# Patient Record
Sex: Male | Born: 1960
Health system: Southern US, Community
[De-identification: ages and names within clinical notes are randomized; demographics above are authoritative.]

## PROBLEM LIST (undated history)

## (undated) DIAGNOSIS — K5190 Ulcerative colitis, unspecified, without complications: Secondary | ICD-10-CM

## (undated) DIAGNOSIS — R7303 Prediabetes: Secondary | ICD-10-CM

## (undated) DIAGNOSIS — G5603 Carpal tunnel syndrome, bilateral upper limbs: Secondary | ICD-10-CM

## (undated) DIAGNOSIS — E790 Hyperuricemia without signs of inflammatory arthritis and tophaceous disease: Secondary | ICD-10-CM

## (undated) HISTORY — DX: Carpal tunnel syndrome, bilateral upper limbs: G56.03

## (undated) HISTORY — DX: Prediabetes: R73.03

## (undated) HISTORY — DX: Hyperuricemia without signs of inflammatory arthritis and tophaceous disease: E79.0

## (undated) HISTORY — DX: Ulcerative colitis, unspecified, without complications: K51.90

---

## 2000-03-16 ENCOUNTER — Ambulatory Visit (HOSPITAL_COMMUNITY): Admission: RE | Admit: 2000-03-16 | Discharge: 2000-03-16 | Payer: Self-pay | Admitting: Gastroenterology

## 2005-01-21 ENCOUNTER — Ambulatory Visit (HOSPITAL_COMMUNITY): Admission: RE | Admit: 2005-01-21 | Discharge: 2005-01-21 | Payer: Self-pay | Admitting: Orthopedic Surgery

## 2007-01-01 IMAGING — CR DG ORBITS FOR FOREIGN BODY
2 series · 2 of 2 positions shown · non-contrast
Comparison: None.

CLINICAL DATA: Pre-MRI.  
 ORBITS ? 2 VIEWS:

[view not recorded (1 of 2)]
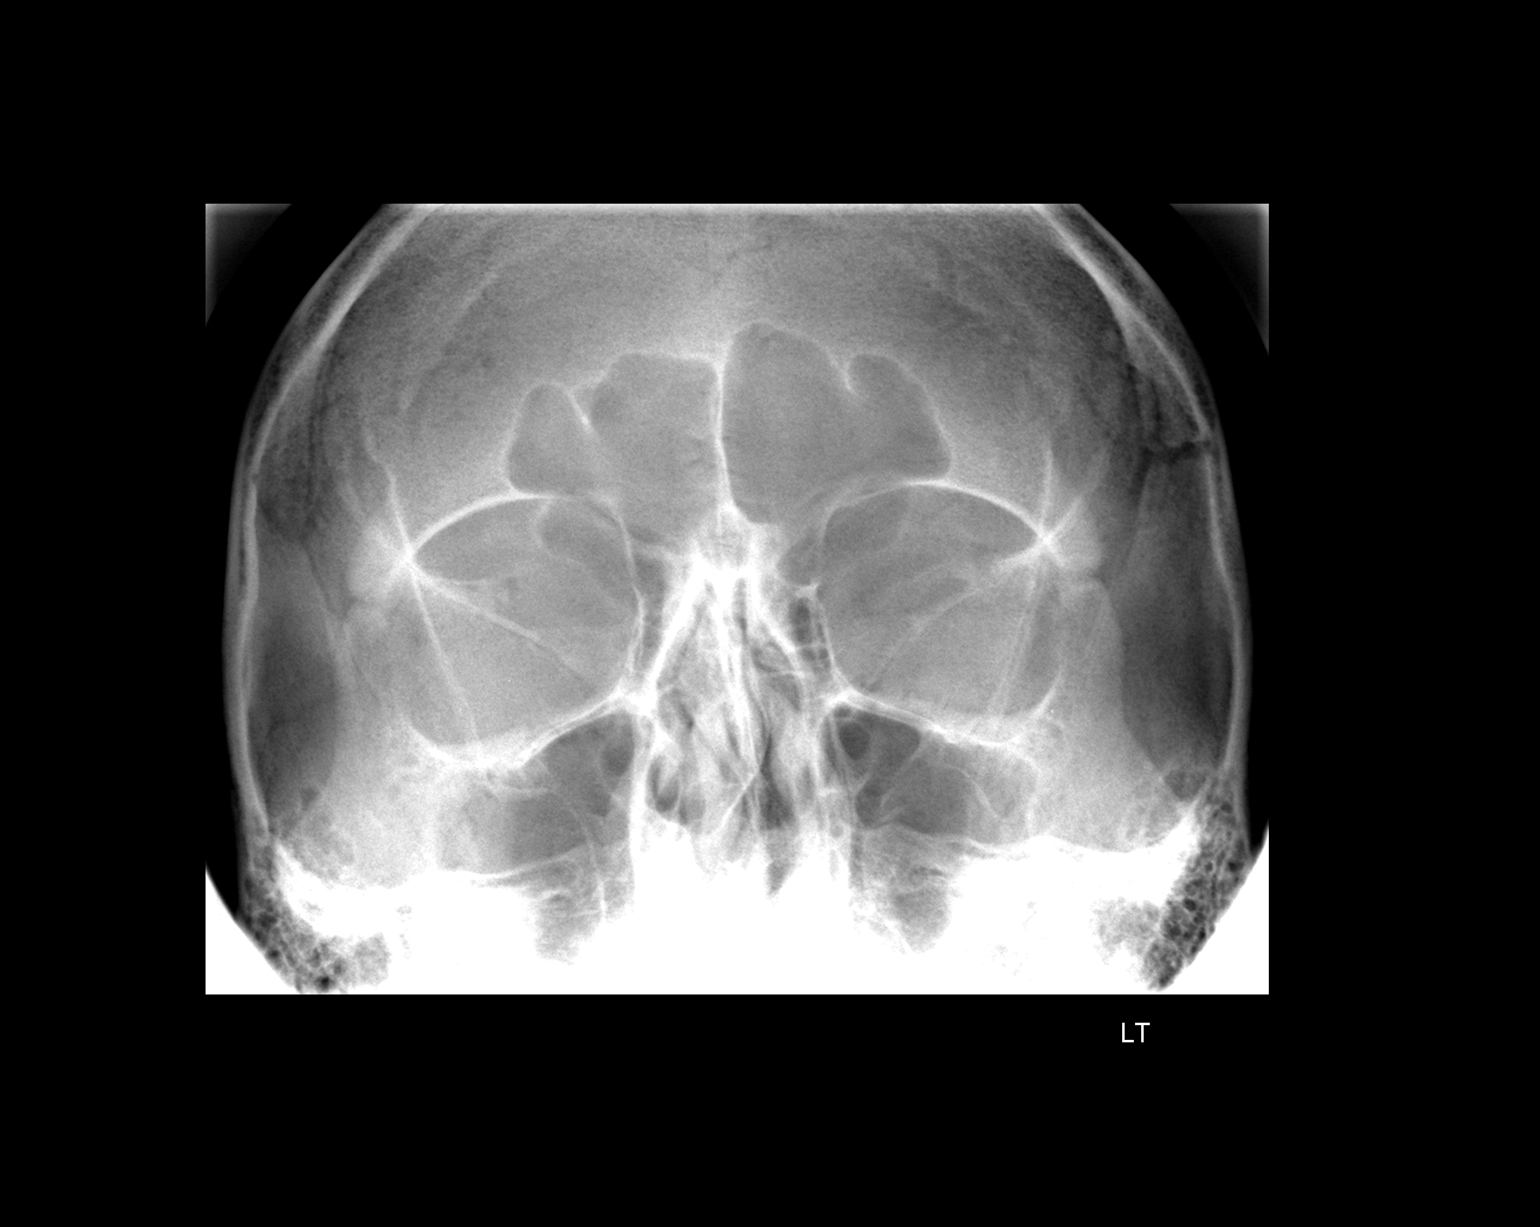

[view not recorded (2 of 2)]
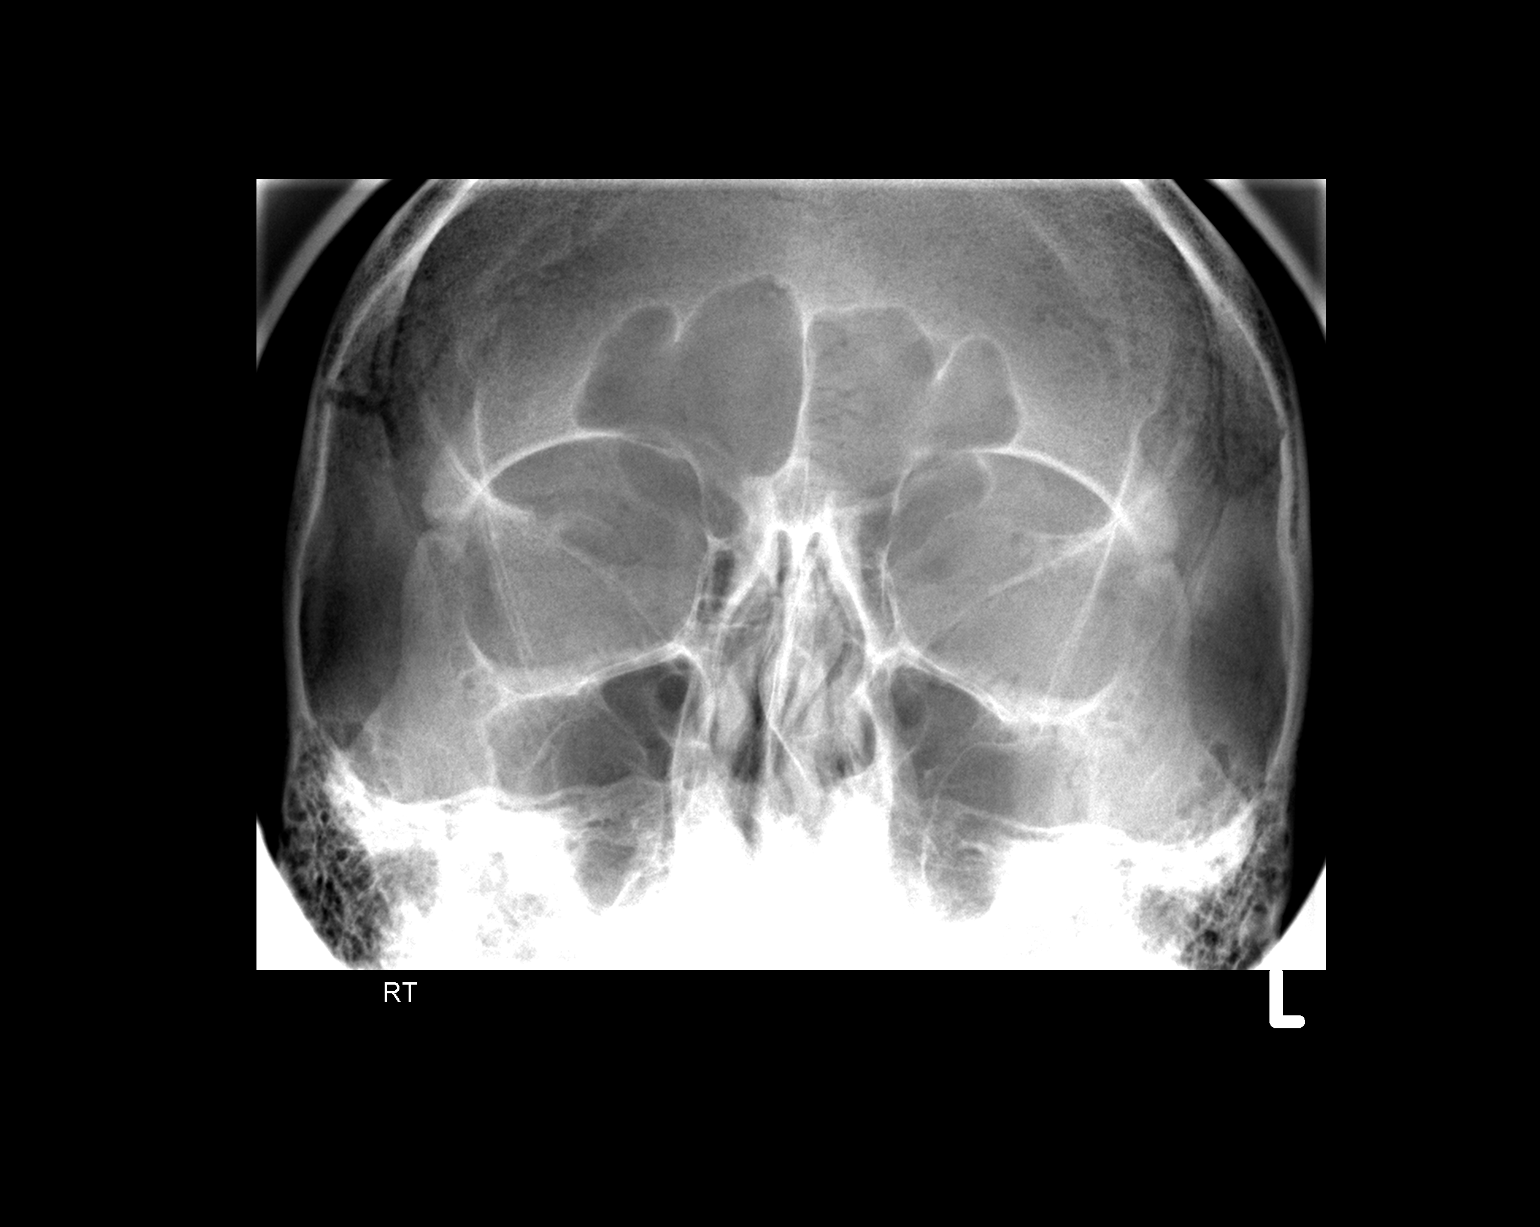

[2 of 2 positions shown; findings below may reference images not displayed]

FINDINGS: No radiopaque foreign bodies are identified.
IMPRESSION: No radiopaque foreign bodies noted.

## 2014-07-07 ENCOUNTER — Emergency Department (HOSPITAL_COMMUNITY): Payer: Worker's Compensation

## 2014-07-07 ENCOUNTER — Emergency Department (HOSPITAL_COMMUNITY)
Admission: EM | Admit: 2014-07-07 | Discharge: 2014-07-07 | Disposition: A | Discharge status: Home / Self Care | Payer: Worker's Compensation | Attending: Emergency Medicine | Admitting: Emergency Medicine

## 2014-07-07 ENCOUNTER — Encounter (HOSPITAL_COMMUNITY): Payer: Self-pay | Admitting: *Deleted

## 2014-07-07 DIAGNOSIS — S61215A Laceration without foreign body of left ring finger without damage to nail, initial encounter: Secondary | ICD-10-CM | POA: Insufficient documentation

## 2014-07-07 DIAGNOSIS — Y9289 Other specified places as the place of occurrence of the external cause: Secondary | ICD-10-CM | POA: Diagnosis not present

## 2014-07-07 DIAGNOSIS — Y998 Other external cause status: Secondary | ICD-10-CM | POA: Diagnosis not present

## 2014-07-07 DIAGNOSIS — Y9389 Activity, other specified: Secondary | ICD-10-CM | POA: Diagnosis not present

## 2014-07-07 DIAGNOSIS — Z23 Encounter for immunization: Secondary | ICD-10-CM | POA: Insufficient documentation

## 2014-07-07 DIAGNOSIS — Y288XXA Contact with other sharp object, undetermined intent, initial encounter: Secondary | ICD-10-CM | POA: Diagnosis not present

## 2014-07-07 DIAGNOSIS — S61219A Laceration without foreign body of unspecified finger without damage to nail, initial encounter: Secondary | ICD-10-CM

## 2014-07-07 MED ORDER — TETANUS-DIPHTH-ACELL PERTUSSIS 5-2.5-18.5 LF-MCG/0.5 IM SUSP
0.5000 mL | Freq: Once | INTRAMUSCULAR | Status: AC
  Administered 2014-07-07: 0.5 mL via INTRAMUSCULAR
  Filled 2014-07-07: qty 0.5

## 2014-07-07 MED ORDER — LIDOCAINE HCL (PF) 1 % IJ SOLN
INTRAMUSCULAR | Status: AC
  Filled 2014-07-07: qty 5

## 2014-07-07 MED ORDER — BACITRACIN ZINC 500 UNIT/GM EX OINT
TOPICAL_OINTMENT | CUTANEOUS | Status: AC
  Filled 2014-07-07: qty 0.9

## 2014-07-07 NOTE — ED Notes (Signed)
Cut left middle finger while putting trash in trash can.

## 2014-07-07 NOTE — ED Notes (Addendum)
Finger is bandaged; pt reports finger was bleeding badly and thinks it will need a few stitches.

## 2014-07-07 NOTE — ED Notes (Signed)
ED PA at bedside

## 2014-07-07 NOTE — ED Provider Notes (Signed)
CSN: [id number]     Arrival date & time 07/07/14  8756 History   First MD Initiated Contact with Patient 07/07/14 [phone]     Chief Complaint  Patient presents with  . Laceration     (Consider location/radiation/quality/duration/timing/severity/associated sxs/prior Treatment) Patient is a 54 y.o. male presenting with skin laceration.  Laceration Location:  Hand Hand laceration location:  L finger Depth:  Through underlying tissue Quality: straight   Bleeding: controlled with pressure   Time since incident: just prior to arrival to the ED. Laceration mechanism:  Broken glass Pain details:    Quality:  Dull   Severity:  Mild Foreign body present:  Unable to specify Relieved by:  Pressure Worsened by:  Nothing tried Tetanus status:  Up to date  Jonathan Reeves is a 54 y.o. male who presents to the ED with a laceration to the let midle finger. He states while putting trash in the trash can he cut his finger on glass.   No past medical history on file. No past surgical history on file. No family history on file. History  Substance Use Topics  . Smoking status: Never Smoker   . Smokeless tobacco: Not on file  . Alcohol Use: 0.6 oz/week    1 Cans of beer per week     Comment: occasional    Review of Systems Negative except as stated in HPI   Allergies  Review of patient's allergies indicates no known allergies.  Home Medications   Prior to Admission medications   Not on File   BP 130/95 mmHg  Pulse 65  Temp(Src) 98 F (36.7 C) (Oral)  Resp 18  Ht 5\' 6"  (1.676 m)  Wt 202 lb (91.627 kg)  BMI 32.62 kg/m2  SpO2 100% Physical Exam  Constitutional: He is oriented to person, place, and time. He appears well-developed and well-nourished.  Eyes: EOM are normal.  Neck: Neck supple.  Pulmonary/Chest: Effort normal.  Abdominal: Soft. There is no tenderness.  Musculoskeletal: Normal range of motion.       Hands: Neurological: He is alert and oriented to person, place, and  time. No cranial nerve deficit.  Skin: Skin is warm and dry.  Nursing note and vitals reviewed.   ED Course  LACERATION REPAIR Date/Time: 07/07/2014 2:38 PM Performed by: Ashley Murrain Authorized by: Ashley Murrain Consent: Verbal consent obtained. Risks and benefits: risks, benefits and alternatives were discussed Consent given by: patient Patient understanding: patient states understanding of the procedure being performed Imaging studies: imaging studies available Required items: required blood products, implants, devices, and special equipment available Patient identity confirmed: verbally with patient Body area: upper extremity Location details: left long finger Laceration length: 1.5 cm Foreign bodies: no foreign bodies Tendon involvement: none Nerve involvement: none Vascular damage: no Anesthesia: local infiltration Local anesthetic: lidocaine 1% without epinephrine Anesthetic total: 2 ml Patient sedated: no Irrigation solution: saline Amount of cleaning: standard Debridement: none Degree of undermining: none Skin closure: 5-0 nylon Number of sutures: 4 Technique: simple Approximation: close Approximation difficulty: simple Dressing: pressure dressing   (including critical care time)  Labs Review Labs Reviewed - No data to display  Imaging Review Dg Finger Middle Left  07/07/2014   CLINICAL DATA:  Laceration.  Pain.  EXAM: LEFT MIDDLE FINGER 2+V  COMPARISON:  None.  FINDINGS: There is no evidence of fracture or dislocation. There is no evidence of arthropathy or other focal bone abnormality. Mild soft tissue swelling without visible foreign body.  IMPRESSION: Negative.  Electronically Signed   By: Rolla Flatten M.D.   On: 07/07/2014 07:51     EKG Interpretation None      MDM  54 y.o. male with laceration to the left middle finger stable for d/c without neurovascular deficits. Patient to follow up in 7 days for suture removal or sooner for problems. Discussed  with the patient x-ray findings and plan of care and all questioned fully answered.   Final diagnoses:  Laceration of finger, left, initial encounter       [facility], NP 07/07/14 1442  Nat Christen, MD 07/08/14 1300

## 2014-10-09 ENCOUNTER — Other Ambulatory Visit: Payer: Self-pay | Admitting: Gastroenterology

## 2015-12-17 ENCOUNTER — Other Ambulatory Visit: Payer: Self-pay | Admitting: Family Medicine

## 2015-12-17 ENCOUNTER — Ambulatory Visit (INDEPENDENT_AMBULATORY_CARE_PROVIDER_SITE_OTHER): Payer: No Typology Code available for payment source | Admitting: Family Medicine

## 2015-12-17 ENCOUNTER — Encounter: Payer: Self-pay | Admitting: Family Medicine

## 2015-12-17 VITALS — BP 122/84 | HR 60 | Temp 97.8°F | Resp 14 | Ht 66.0 in | Wt 215.0 lb

## 2015-12-17 DIAGNOSIS — Z0000 Encounter for general adult medical examination without abnormal findings: Secondary | ICD-10-CM | POA: Diagnosis not present

## 2015-12-17 LAB — PSA: PSA: 0.4 ng/mL (ref <=4.0)

## 2015-12-17 NOTE — Progress Notes (Signed)
Subjective:    Patient ID: Jonathan Reeves, male    DOB: [birth year], 55 y.o.   MRN: [id number]  HPI The patient is a very pleasant 55 year old white male here today to establish care and for complete physical exam. He has no medical concerns outside of some slight pain with external rotation of his left hip. It is not problematic for him. His tetanus shot was performed last year. He is due for a flu shot but he politely declines. He does have a past medical history of ulcerative colitis and he is regularly getting colonoscopies under the care of Dr. Amedeo Plenty. Outside of that he brings in lab work from his screening test at work. He is a firefighter/EMT. His lab work is only significant for fasting blood sugar of 108. The remainder of his labs including a CBC, CMP, fasting lipid panel are outstanding. LDL cholesterol was 119. Otherwise lab work was normal. He did have a slightly elevated uric acid level of 7.1. He has no history of gout and he is asymptomatic Past Medical History:  Diagnosis Date  . Carpal tunnel syndrome on both sides   . Ulcerative colitis ([place])    Dr. Amedeo Plenty   No past surgical history on file. No current outpatient prescriptions on file prior to visit.   No current facility-administered medications on file prior to visit.    No Known Allergies Social History   Social History  . Marital status: Married    Spouse name: N/A  . Number of children: N/A  . Years of education: N/A   Occupational History  . Not on file.   Social History Main Topics  . Smoking status: Never Smoker  . Smokeless tobacco: Never Used  . Alcohol use 0.6 oz/week    1 Cans of beer per week     Comment: occasional  . Drug use: Unknown  . Sexual activity: Not on file   Other Topics Concern  . Not on file   Social History Narrative  . No narrative on file   Family History  Problem Relation Age of Onset  . Arthritis Mother   . Diabetes Father   . Alcohol abuse Father   . COPD Maternal  Grandfather   . Cancer Paternal Grandfather   . Lupus Daughter       Review of Systems  All other systems reviewed and are negative.      Objective:   Physical Exam  Constitutional: He is oriented to person, place, and time. He appears well-developed and well-nourished. No distress.  HENT:  Head: Normocephalic and atraumatic.  Right Ear: External ear normal.  Left Ear: External ear normal.  Nose: Nose normal.  Mouth/Throat: Oropharynx is clear and moist. No oropharyngeal exudate.  Eyes: Conjunctivae and EOM are normal. Pupils are equal, round, and reactive to light. Right eye exhibits no discharge. Left eye exhibits no discharge. No scleral icterus.  Neck: Normal range of motion. Neck supple. No JVD present. No tracheal deviation present. No thyromegaly present.  Cardiovascular: Normal rate, regular rhythm, normal heart sounds and intact distal pulses.  Exam reveals no gallop and no friction rub.   No murmur heard. Pulmonary/Chest: Effort normal and breath sounds normal. No stridor. No respiratory distress. He has no wheezes. He has no rales. He exhibits no tenderness.  Abdominal: Soft. Bowel sounds are normal. He exhibits no distension and no mass. There is no tenderness. There is no rebound and no guarding.  Musculoskeletal: Normal range of motion. He exhibits no edema,  tenderness or deformity.  Lymphadenopathy:    He has no cervical adenopathy.  Neurological: He is alert and oriented to person, place, and time. He has normal reflexes. He displays normal reflexes. No cranial nerve deficit. He exhibits normal muscle tone. Coordination normal.  Skin: Skin is warm. No rash noted. He is not diaphoretic. No erythema. No pallor.  Psychiatric: He has a normal mood and affect. His behavior is normal. Judgment and thought content normal.  Vitals reviewed.         Assessment & Plan:  Routine general medical examination at a health care facility - Plan: Hepatitis C Ab Reflex HCV RNA,  QUANT, PSA, HIV antibody  Physical exam today is completely normal. He declines a flu shot. The remainder of his cancer screening is up-to-date. I will screen the patient for HIV, hepatitis C, and PSA for prostate cancer. We discussed a low carbohydrate diet. Recheck in one year or as needed

## 2015-12-18 LAB — HEPATITIS C ANTIBODY: HCV Ab: NEGATIVE

## 2015-12-18 LAB — HIV ANTIBODY (ROUTINE TESTING W REFLEX): HIV: NONREACTIVE

## 2016-01-31 ENCOUNTER — Ambulatory Visit (INDEPENDENT_AMBULATORY_CARE_PROVIDER_SITE_OTHER): Payer: No Typology Code available for payment source | Admitting: Family Medicine

## 2016-01-31 ENCOUNTER — Encounter: Payer: Self-pay | Admitting: Family Medicine

## 2016-01-31 VITALS — BP 140/78 | HR 60 | Temp 98.8°F | Resp 14 | Ht 66.0 in | Wt 220.0 lb

## 2016-01-31 DIAGNOSIS — L723 Sebaceous cyst: Secondary | ICD-10-CM

## 2016-01-31 DIAGNOSIS — L089 Local infection of the skin and subcutaneous tissue, unspecified: Secondary | ICD-10-CM | POA: Diagnosis not present

## 2016-01-31 MED ORDER — SULFAMETHOXAZOLE-TRIMETHOPRIM 800-160 MG PO TABS: 1.0000 | ORAL_TABLET | Freq: Two times a day (BID) | ORAL | 0 refills | Status: DC

## 2016-01-31 NOTE — Progress Notes (Signed)
   Subjective:    Patient ID: Jonathan Reeves, male    DOB: 1960-08-08, 55 y.o.   MRN: [id number]  HPI Patient has a large infected sebaceous cyst on his right posterior shoulder just below his scapula. Areas 5 cm x 5 cm with a hard fluctuant area 2 cm in diameter. There is streaking erythema radiating into his axilla and into his right chest. Past Medical History:  Diagnosis Date  . Carpal tunnel syndrome on both sides   . Ulcerative colitis ([place])    Dr. Amedeo Plenty   No past surgical history on file. No current outpatient prescriptions on file prior to visit.   No current facility-administered medications on file prior to visit.    No Known Allergies Social History   Social History  . Marital status: Married    Spouse name: N/A  . Number of children: N/A  . Years of education: N/A   Occupational History  . Not on file.   Social History Main Topics  . Smoking status: Never Smoker  . Smokeless tobacco: Never Used  . Alcohol use 0.6 oz/week    1 Cans of beer per week     Comment: occasional  . Drug use: Unknown  . Sexual activity: Not on file   Other Topics Concern  . Not on file   Social History Narrative  . No narrative on file      Review of Systems  All other systems reviewed and are negative.      Objective:   Physical Exam  Cardiovascular: Normal rate, regular rhythm and normal heart sounds.   Pulmonary/Chest: Effort normal and breath sounds normal.  Skin: Skin is warm. There is erythema.  Vitals reviewed.         Assessment & Plan:  Infected sebaceous cyst - Plan: sulfamethoxazole-trimethoprim (BACTRIM DS,SEPTRA DS) 800-160 MG tablet  Cyst was anesthetized with 0.1% lidocaine without epinephrine. A 2 cm long vertical incision was made into the roof of the cyst. Copious amounts of purulent foul-smelling white drainage was removed after the opening was enlarged with hemostats. The cyst cavity was then probed several times with Q-tips soaked in hydrogen  peroxide. The wound was then packed with 12 inches of one quarter-inch iodoform gauze. Begin Bactrim double strength tablets 1 by mouth twice a day for 10 days. We'll begin removing the packing tomorrow. Wound care was discussed. Recheck in one week or sooner if worse

## 2016-02-18 ENCOUNTER — Ambulatory Visit: Payer: No Typology Code available for payment source | Admitting: Family Medicine

## 2016-12-18 ENCOUNTER — Encounter: Payer: Self-pay | Admitting: Family Medicine

## 2016-12-18 ENCOUNTER — Ambulatory Visit (INDEPENDENT_AMBULATORY_CARE_PROVIDER_SITE_OTHER): Payer: No Typology Code available for payment source | Admitting: Family Medicine

## 2016-12-18 VITALS — BP 118/72 | HR 65 | Temp 97.7°F | Resp 16 | Ht 66.5 in | Wt 209.0 lb

## 2016-12-18 DIAGNOSIS — Z125 Encounter for screening for malignant neoplasm of prostate: Secondary | ICD-10-CM

## 2016-12-18 DIAGNOSIS — R739 Hyperglycemia, unspecified: Secondary | ICD-10-CM | POA: Diagnosis not present

## 2016-12-18 DIAGNOSIS — Z0000 Encounter for general adult medical examination without abnormal findings: Secondary | ICD-10-CM

## 2016-12-18 DIAGNOSIS — E790 Hyperuricemia without signs of inflammatory arthritis and tophaceous disease: Secondary | ICD-10-CM | POA: Diagnosis not present

## 2016-12-18 LAB — LIPID PANEL
Cholesterol: 174 mg/dL (ref <200)
HDL: 51 mg/dL (ref >40)
LDL Cholesterol (Calc): 106 mg/dL (calc) — ABNORMAL HIGH
NON-HDL CHOLESTEROL (CALC): 123 mg/dL (ref <130)
TRIGLYCERIDES: 83 mg/dL (ref <150)
Total CHOL/HDL Ratio: 3.4 (calc) (ref <5.0)

## 2016-12-18 LAB — CBC WITH DIFFERENTIAL/PLATELET
Basophils Absolute: 21 cells/uL (ref 0–200)
Basophils Relative: 0.4 %
EOS ABS: 32 {cells}/uL (ref 15–500)
Eosinophils Relative: 0.6 %
HCT: 45.4 % (ref 38.5–50.0)
Hemoglobin: 15.8 g/dL (ref 13.2–17.1)
LYMPHS ABS: 1564 {cells}/uL (ref 850–3900)
MCH: 29.9 pg (ref 27.0–33.0)
MCHC: 34.8 g/dL (ref 32.0–36.0)
MCV: 85.8 fL (ref 80.0–100.0)
MPV: 10.8 fL (ref 7.5–12.5)
Monocytes Relative: 8.8 %
NEUTROS PCT: 60.7 %
Neutro Abs: 3217 cells/uL (ref 1500–7800)
PLATELETS: 208 10*3/uL (ref 140–400)
RBC: 5.29 10*6/uL (ref 4.20–5.80)
RDW: 12.4 % (ref 11.0–15.0)
Total Lymphocyte: 29.5 %
WBC mixed population: 466 cells/uL (ref 200–950)
WBC: 5.3 10*3/uL (ref 3.8–10.8)

## 2016-12-18 LAB — URIC ACID: Uric Acid, Serum: 7.2 mg/dL (ref 4.0–8.0)

## 2016-12-18 LAB — COMPLETE METABOLIC PANEL WITH GFR
AG Ratio: 2.2 (calc) (ref 1.0–2.5)
ALT: 27 U/L (ref 9–46)
AST: 17 U/L (ref 10–35)
Albumin: 4.6 g/dL (ref 3.6–5.1)
Alkaline phosphatase (APISO): 62 U/L (ref 40–115)
BUN: 16 mg/dL (ref 7–25)
CO2: 31 mmol/L (ref 20–32)
Calcium: 10 mg/dL (ref 8.6–10.3)
Chloride: 104 mmol/L (ref 98–110)
Creat: 1.18 mg/dL (ref 0.70–1.33)
GFR, Est African American: 78 mL/min/{1.73_m2} (ref >=60)
GFR, Est Non African American: 68 mL/min/{1.73_m2} (ref >=60)
GLOBULIN: 2.1 g/dL (ref 1.9–3.7)
Glucose, Bld: 116 mg/dL — ABNORMAL HIGH (ref 65–99)
POTASSIUM: 4.9 mmol/L (ref 3.5–5.3)
Sodium: 140 mmol/L (ref 135–146)
Total Bilirubin: 0.8 mg/dL (ref 0.2–1.2)
Total Protein: 6.7 g/dL (ref 6.1–8.1)

## 2016-12-18 LAB — PSA: PSA: 0.6 ng/mL (ref <=4.0)

## 2016-12-18 NOTE — Progress Notes (Signed)
Subjective:    Patient ID: Era Skeen, male    DOB: 1960/09/05, 56 y.o.   MRN: [id number]  HPI The patient is a very pleasant 56 year old white male here today for complete physical exam.  He does have a past medical history of ulcerative colitis and he is regularly getting colonoscopies under the care of Dr. Amedeo Plenty. On lab work from last year, the patient was found to have a slightly elevated uric acid level greater than 7, a fasting blood sugar of 108, and a slightly elevated LDL cholesterol. However I do not have lab work within the last 12 months to review today. The patient agrees to repeat the lab work today to follow his medical conditions. He had hepatitis C screening performed last year and therefore this is up-to-date. He is also due for a PSA. He is due for a shingles vaccine as well as a flu shot but the patient politely declines that His last colonoscopy was 2017. Past Medical History:  Diagnosis Date  . Carpal tunnel syndrome on both sides   . Ulcerative colitis ([place])    Dr. Amedeo Plenty   No past surgical history on file. No current outpatient prescriptions on file prior to visit.   No current facility-administered medications on file prior to visit.    No Known Allergies Social History   Social History  . Marital status: Married    Spouse name: N/A  . Number of children: N/A  . Years of education: N/A   Occupational History  . Not on file.   Social History Main Topics  . Smoking status: Never Smoker  . Smokeless tobacco: Never Used  . Alcohol use 0.6 oz/week    1 Cans of beer per week     Comment: occasional  . Drug use: Unknown  . Sexual activity: Not on file   Other Topics Concern  . Not on file   Social History Narrative  . No narrative on file   Family History  Problem Relation Age of Onset  . Arthritis Mother   . Diabetes Father   . Alcohol abuse Father   . COPD Maternal Grandfather   . Cancer Paternal Grandfather   . Lupus Daughter        Review of Systems  All other systems reviewed and are negative.      Objective:   Physical Exam  Constitutional: He is oriented to person, place, and time. He appears well-developed and well-nourished. No distress.  HENT:  Head: Normocephalic and atraumatic.  Right Ear: External ear normal.  Left Ear: External ear normal.  Nose: Nose normal.  Mouth/Throat: Oropharynx is clear and moist. No oropharyngeal exudate.  Eyes: Pupils are equal, round, and reactive to light. Conjunctivae and EOM are normal. Right eye exhibits no discharge. Left eye exhibits no discharge. No scleral icterus.  Neck: Normal range of motion. Neck supple. No JVD present. No tracheal deviation present. No thyromegaly present.  Cardiovascular: Normal rate, regular rhythm, normal heart sounds and intact distal pulses.  Exam reveals no gallop and no friction rub.   No murmur heard. Pulmonary/Chest: Effort normal and breath sounds normal. No stridor. No respiratory distress. He has no wheezes. He has no rales. He exhibits no tenderness.  Abdominal: Soft. Bowel sounds are normal. He exhibits no distension and no mass. There is no tenderness. There is no rebound and no guarding.  Musculoskeletal: Normal range of motion. He exhibits no edema, tenderness or deformity.  Lymphadenopathy:    He has no cervical  adenopathy.  Neurological: He is alert and oriented to person, place, and time. He has normal reflexes. No cranial nerve deficit. He exhibits normal muscle tone. Coordination normal.  Skin: Skin is warm. No rash noted. He is not diaphoretic. No erythema. No pallor.  Psychiatric: He has a normal mood and affect. His behavior is normal. Judgment and thought content normal.  Vitals reviewed.         Assessment & Plan:  General medical exam - Plan: CBC with Differential/Platelet, COMPLETE METABOLIC PANEL WITH GFR, Lipid panel, PSA, Uric acid, CANCELED: Hepatitis C Antibody  Physical exam today is completely  normal. He declines a flu shot. The remainder of his cancer screening is up-to-date. I will check a CBC, CMP, and a fasting lipid panel area if his fasting blood sugar is elevated, I will add a hemoglobin A1c. I will also monitor his uric acid level. Colonoscopy is up-to-date. I will check a PSA for prostate cancer. Patient has already been screened for hepatitis C. I recommended a shingles vaccine and the flu shot but the patient politely declined

## 2016-12-19 ENCOUNTER — Encounter: Payer: Self-pay | Admitting: Family Medicine

## 2016-12-19 DIAGNOSIS — E790 Hyperuricemia without signs of inflammatory arthritis and tophaceous disease: Secondary | ICD-10-CM | POA: Insufficient documentation

## 2016-12-19 DIAGNOSIS — R7303 Prediabetes: Secondary | ICD-10-CM | POA: Insufficient documentation

## 2017-05-17 ENCOUNTER — Ambulatory Visit (INDEPENDENT_AMBULATORY_CARE_PROVIDER_SITE_OTHER): Payer: No Typology Code available for payment source | Admitting: Orthopedic Surgery

## 2017-05-23 ENCOUNTER — Encounter (INDEPENDENT_AMBULATORY_CARE_PROVIDER_SITE_OTHER): Payer: Self-pay | Admitting: Orthopedic Surgery

## 2017-05-23 ENCOUNTER — Ambulatory Visit (INDEPENDENT_AMBULATORY_CARE_PROVIDER_SITE_OTHER): Payer: No Typology Code available for payment source | Admitting: Orthopedic Surgery

## 2017-05-23 ENCOUNTER — Ambulatory Visit (INDEPENDENT_AMBULATORY_CARE_PROVIDER_SITE_OTHER): Payer: No Typology Code available for payment source

## 2017-05-23 DIAGNOSIS — M25512 Pain in left shoulder: Secondary | ICD-10-CM

## 2017-05-23 DIAGNOSIS — G8929 Other chronic pain: Secondary | ICD-10-CM

## 2017-05-27 NOTE — Progress Notes (Signed)
Office Visit Note   Patient: Jonathan Reeves           Date of Birth: 1961-01-13           MRN: [id number] Visit Date: 05/23/2017 Requested by: Susy Frizzle, MD [address] [place], [place] [address] PCP: Susy Frizzle, MD  Subjective: Chief Complaint  Patient presents with  . Left Shoulder - Pain    HPI: Jonathan Reeves is a patient with left shoulder pain.  Denies any history of injury.  Reports pain for a year on and off.  Been going to physical therapy.  Agreed upon diagnosis from there is impingement versus adhesive capsulitis.  The pain will wake him from sleep at night.  He reports some neck pain with radiation into the left arm.  Describes occasional numbness and tingling in the right arm.  Reports increased pain in the left shoulder with overhead activity.  Takes ibuprofen as needed.              ROS: All systems reviewed are negative as they relate to the chief complaint within the history of present illness.  Patient denies  fevers or chills.   Assessment & Plan: Visit Diagnoses:  1. Chronic left shoulder pain     Plan: Impression is adhesive capsulitis left shoulder with good rotator cuff strength.  Structurally the shoulder looks intact.  Degenerative changes are noted in the cervical spine and I think this may be a component of some of his right arm symptoms and possible left arm symptoms.  Nonetheless though objectively today he does have restriction of passive range of motion consistent with early adhesive capsulitis.  I think if he continues to work on that and he will improve over time.  Discussed the algorithmic approach to this problem in terms of injections manipulation and arthroscopy with rotator interval release.  I will see him back as needed  Follow-Up Instructions: Return if symptoms worsen or fail to improve.   Orders:  Orders Placed This Encounter  Procedures  . XR Shoulder Left  . XR Cervical Spine 2 or 3 views   No orders of the defined types  were placed in this encounter.     Procedures: No procedures performed   Clinical Data: No additional findings.  Objective: Vital Signs: There were no vitals taken for this visit.  Physical Exam:   Constitutional: Patient appears well-developed HEENT:  Head: Normocephalic Eyes:EOM are normal Neck: Normal range of motion Cardiovascular: Normal rate Pulmonary/chest: Effort normal Neurologic: Patient is alert Skin: Skin is warm Psychiatric: Patient has normal mood and affect    Ortho Exam: Orthopedic exam demonstrates good cervical spine range of motion.  5 out of 5 grip EPL FPL interosseous wrist flexion wrist extension biceps triceps and deltoid strength.  Radial pulse intact bilaterally.  No definite paresthesias C5 T1.  Does have restricted external rotation on the left compared to the right with about 40 degrees of external rotation on the left compared to 60 on the right.  Forward flexion on the left is about 120 compared to 180 on the right.  Rotator cuff strength intact on the left-hand side to infraspinatus supraspinatus and subscap muscle testing  Specialty Comments:  No specialty comments available.  Imaging: No results found.   PMFS History: Patient Active Problem List   Diagnosis Date Noted  . Prediabetes   . Elevated uric acid in blood    Past Medical History:  Diagnosis Date  . Carpal tunnel  syndrome on both sides   . Elevated uric acid in blood   . Prediabetes   . Ulcerative colitis ([place])    Dr. Amedeo Plenty    Family History  Problem Relation Age of Onset  . Arthritis Mother   . Diabetes Father   . Alcohol abuse Father   . COPD Maternal Grandfather   . Cancer Paternal Grandfather   . Lupus Daughter     History reviewed. No pertinent surgical history. Social History   Occupational History  . Not on file  Tobacco Use  . Smoking status: Never Smoker  . Smokeless tobacco: Never Used  Substance and Sexual Activity  . Alcohol use: Yes     Alcohol/week: 0.6 oz    Types: 1 Cans of beer per week    Comment: occasional  . Drug use: Not on file  . Sexual activity: Not on file

## 2017-09-04 ENCOUNTER — Encounter: Payer: Self-pay | Admitting: Family Medicine

## 2017-09-04 ENCOUNTER — Ambulatory Visit: Payer: No Typology Code available for payment source | Admitting: Family Medicine

## 2017-09-04 VITALS — BP 110/72 | HR 64 | Temp 98.3°F | Resp 14 | Ht 66.5 in | Wt 209.0 lb

## 2017-09-04 DIAGNOSIS — R480 Dyslexia and alexia: Secondary | ICD-10-CM | POA: Diagnosis not present

## 2017-09-04 NOTE — Progress Notes (Signed)
Subjective:    Patient ID: Jonathan Reeves, male    DOB: [birth year], 57 y.o.   MRN: [id number]  HPI Patient states that he has had a learning disability his entire life.  He states that he is always had trouble reading.  He states that he barely made it through school, "by the skin of his teeth"..  He states that he has to reread passages 2 and 3 times to process the information.  Frequently he will insert words while reading that are present.  He will also have a difficult time understanding what is written.  Therefore he requires extended periods of time while taking test in order to read the question thoroughly multiple times so that he can answer it appropriately.  He states that he does not have similar problems learning from an auditory perspective.  He states that if someone explains something to him, he can easily understand it and apply what is learned.  Frequently people have to show him how to do things only once or twice and is able to pick up on things quickly.  However he has a difficult time reading material and understanding it.  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  He is requesting testing so that he can be allowed additional time to take the test from a disability standpoint. Past Medical History:  Diagnosis Date  . Carpal tunnel syndrome on both sides   . Elevated uric acid in blood   . Prediabetes   . Ulcerative colitis ([place])    Dr. Amedeo Plenty   No past surgical history on file. No current outpatient medications on file prior to visit.   No current facility-administered medications on file prior to visit.    No Known Allergies Social History   Socioeconomic History  . Marital status: Married    Spouse name: Not on file  . Number of children: Not on file  . Years of education: Not on file  . Highest  education level: Not on file  Occupational History  . Not on file  Social Needs  . Financial resource strain: Not on file  . Food insecurity:    Worry: Not on file    Inability: Not on file  . Transportation needs:    Medical: Not on file    Non-medical: Not on file  Tobacco Use  . Smoking status: Never Smoker  . Smokeless tobacco: Never Used  Substance and Sexual Activity  . Alcohol use: Yes    Alcohol/week: 0.6 oz    Types: 1 Cans of beer per week    Comment: occasional  . Drug use: Not on file  . Sexual activity: Not on file  Lifestyle  . Physical activity:    Days per week: Not on file    Minutes per session: Not on file  . Stress: Not on file  Relationships  . Social connections:    Talks on phone: Not on file    Gets together: Not on file    Attends religious service: Not on file    Active member of club or organization: Not on file    Attends meetings of clubs or organizations: Not on file    Relationship status: Not on file  . Intimate partner violence:    Fear of current or ex partner:  Not on file    Emotionally abused: Not on file    Physically abused: Not on file    Forced sexual activity: Not on file  Other Topics Concern  . Not on file  Social History Narrative  . Not on file   Family History  Problem Relation Age of Onset  . Arthritis Mother   . Diabetes Father   . Alcohol abuse Father   . COPD Maternal Grandfather   . Cancer Paternal Grandfather   . Lupus Daughter       Review of Systems  All other systems reviewed and are negative.      Objective:   Physical Exam  Constitutional: He is oriented to person, place, and time. He appears well-developed and well-nourished. No distress.  HENT:  Head: Normocephalic and atraumatic.  Right Ear: External ear normal.  Left Ear: External ear normal.  Nose: Nose normal.  Mouth/Throat: Oropharynx is clear and moist. No oropharyngeal exudate.  Eyes: Pupils are equal, round, and reactive to light.  Conjunctivae and EOM are normal. Right eye exhibits no discharge. Left eye exhibits no discharge. No scleral icterus.  Neck: Normal range of motion. Neck supple. No JVD present. No tracheal deviation present. No thyromegaly present.  Cardiovascular: Normal rate, regular rhythm, normal heart sounds and intact distal pulses. Exam reveals no gallop and no friction rub.  No murmur heard. Pulmonary/Chest: Effort normal and breath sounds normal. No stridor. No respiratory distress. He has no wheezes. He has no rales. He exhibits no tenderness.  Abdominal: Soft. Bowel sounds are normal. He exhibits no distension and no mass. There is no tenderness. There is no rebound and no guarding.  Musculoskeletal: Normal range of motion. He exhibits no edema, tenderness or deformity.  Lymphadenopathy:    He has no cervical adenopathy.  Neurological: He is alert and oriented to person, place, and time. He has normal reflexes. No cranial nerve deficit. He exhibits normal muscle tone. Coordination normal.  Skin: Skin is warm. No rash noted. He is not diaphoretic. No erythema. No pallor.  Psychiatric: He has a normal mood and affect. His behavior is normal. Judgment and thought content normal.  Vitals reviewed.         Assessment & Plan:  Dyslexia  History is concerning for some type of visual learning disability similar to the dyslexia.  Therefore I will consult psychology for formal testing to determine if the patient does not fact have dyslexia.  I would be willing to write a letter on the patient's behalf stating that the patient needs an additional 30% more time to take his written test.  The patient is asking for more peer he states that it frequently takes him twice as long as he takes other candidates to take a similar test.  Therefore on a 3-hour test, he states that he may need as long as 6 hours in order to read the material and process it.  I do not feel comfortable allowing him 100% extra time.   Therefore I will consult psychology for formal testing to determine the extent of his learning disability.

## 2017-09-18 ENCOUNTER — Encounter: Payer: Self-pay | Admitting: Family Medicine

## 2017-12-24 ENCOUNTER — Ambulatory Visit (INDEPENDENT_AMBULATORY_CARE_PROVIDER_SITE_OTHER): Payer: No Typology Code available for payment source | Admitting: Family Medicine

## 2017-12-24 ENCOUNTER — Encounter: Payer: Self-pay | Admitting: Family Medicine

## 2017-12-24 VITALS — BP 138/70 | HR 58 | Temp 98.1°F | Resp 16 | Ht 66.5 in | Wt 201.0 lb

## 2017-12-24 DIAGNOSIS — R7303 Prediabetes: Secondary | ICD-10-CM

## 2017-12-24 DIAGNOSIS — Z0000 Encounter for general adult medical examination without abnormal findings: Secondary | ICD-10-CM | POA: Diagnosis not present

## 2017-12-24 NOTE — Progress Notes (Signed)
Subjective:    Patient ID: Jonathan Reeves, male    DOB: 03-29-1960, 57 y.o.   MRN: [id number]  HPI Patient is a very polite 57 year old Caucasian male here today for a complete physical exam.  His last colonoscopy was 2017.  He is due again in 2022.  He is due for prostate cancer screening.  His immunizations are up-to-date except for the shingles vaccine which we discussed today and his flu shot.  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  He has had hepatitis C screening and HIV screening performed in 2017.  These are all up-to-date.  He denies any medical concerns today outside of some right knee pain.  This is been going on for several months.  It hurts going down steps.  It also gets very stiff after he sits for prolonged period of time.  The pain and stiffness seem to be located over both joint lines and under the patella suggesting tricompartmental arthritis.  He denies any locking or laxity.  He denies any effusion.  He is not interested in any imaging at the present time.  He states that it slowly getting better Past Medical History:  Diagnosis Date  . Carpal tunnel syndrome on both sides   . Elevated uric acid in blood   . Prediabetes   . Ulcerative colitis ([place])    Dr. Amedeo Plenty   No past surgical history on file. No current outpatient medications on file prior to visit.   No current facility-administered medications on file prior to visit.    No Known Allergies Social History   Socioeconomic History  . Marital status: Married    Spouse name: Not on file  . Number of children: Not on file  . Years of education: Not on file  . Highest education level: Not on file  Occupational History  . Not on file  Social Needs  . Financial resource strain: Not on file  . Food insecurity:    Worry: Not on file    Inability: Not on file  . Transportation needs:    Medical: Not on file    Non-medical: Not on file  Tobacco Use  . Smoking status: Never Smoker    . Smokeless tobacco: Never Used  Substance and Sexual Activity  . Alcohol use: Yes    Alcohol/week: 1.0 standard drinks    Types: 1 Cans of beer per week    Comment: occasional  . Drug use: Not on file  . Sexual activity: Not on file  Lifestyle  . Physical activity:    Days per week: Not on file    Minutes per session: Not on file  . Stress: Not on file  Relationships  . Social connections:    Talks on phone: Not on file    Gets together: Not on file    Attends religious service: Not on file    Active member of club or organization: Not on file    Attends meetings of clubs or organizations: Not on file    Relationship status: Not on file  . Intimate partner violence:    Fear of current or ex partner: Not on file    Emotionally abused: Not on file    Physically abused: Not on file    Forced sexual activity: Not on file  Other Topics Concern  . Not on file  Social History Narrative  . Not on file   Family History  Problem Relation Age of  Onset  . Arthritis Mother   . Diabetes Father   . Alcohol abuse Father   . COPD Maternal Grandfather   . Cancer Paternal Grandfather   . Lupus Daughter       Review of Systems  All other systems reviewed and are negative.      Objective:   Physical Exam  Constitutional: He is oriented to person, place, and time. He appears well-developed and well-nourished. No distress.  HENT:  Head: Normocephalic and atraumatic.  Right Ear: External ear normal.  Left Ear: External ear normal.  Nose: Nose normal.  Mouth/Throat: Oropharynx is clear and moist. No oropharyngeal exudate.  Eyes: Pupils are equal, round, and reactive to light. Conjunctivae and EOM are normal. Right eye exhibits no discharge. Left eye exhibits no discharge. No scleral icterus.  Neck: Normal range of motion. Neck supple. No JVD present. No tracheal deviation present. No thyromegaly present.  Cardiovascular: Normal rate, regular rhythm, normal heart sounds and intact  distal pulses. Exam reveals no gallop and no friction rub.  No murmur heard. Pulmonary/Chest: Effort normal and breath sounds normal. No stridor. No respiratory distress. He has no wheezes. He has no rales. He exhibits no tenderness.  Abdominal: Soft. Bowel sounds are normal. He exhibits no distension and no mass. There is no tenderness. There is no rebound and no guarding.  Musculoskeletal: Normal range of motion. He exhibits no edema, tenderness or deformity.  Lymphadenopathy:    He has no cervical adenopathy.  Neurological: He is alert and oriented to person, place, and time. He has normal reflexes. No cranial nerve deficit. He exhibits normal muscle tone. Coordination normal.  Skin: Skin is warm. No rash noted. He is not diaphoretic. No erythema. No pallor.  Psychiatric: He has a normal mood and affect. His behavior is normal. Judgment and thought content normal.  Vitals reviewed.         Assessment & Plan:  General medical exam - Plan: CBC with Differential/Platelet, COMPLETE METABOLIC PANEL WITH GFR, Lipid panel, PSA  Prediabetes - Plan: Hemoglobin A1c  Physical exam today is completely normal. He declines a flu shot. The remainder of his cancer screening is up-to-date. I will check a CBC, CMP, and a fasting lipid panel area if his fasting blood sugar is elevated, I will add a hemoglobin A1c.  He has prediabetes on his most recent lab work with a blood sugar of 116. I will check a PSA for prostate cancer. Patient has already been screened for hepatitis C. I recommended a shingles vaccine and the flu shot but the patient politely declined

## 2017-12-25 ENCOUNTER — Encounter: Payer: Self-pay | Admitting: *Deleted

## 2017-12-25 LAB — HEMOGLOBIN A1C
Hgb A1c MFr Bld: 5.4 % of total Hgb (ref <5.7)
Mean Plasma Glucose: 108 (calc)
eAG (mmol/L): 6 (calc)

## 2017-12-25 LAB — CBC WITH DIFFERENTIAL/PLATELET
Basophils Absolute: 18 cells/uL (ref 0–200)
Basophils Relative: 0.3 %
Eosinophils Absolute: 41 cells/uL (ref 15–500)
Eosinophils Relative: 0.7 %
HCT: 45.4 % (ref 38.5–50.0)
Hemoglobin: 15.4 g/dL (ref 13.2–17.1)
Lymphs Abs: 1965 cells/uL (ref 850–3900)
MCH: 29.3 pg (ref 27.0–33.0)
MCHC: 33.9 g/dL (ref 32.0–36.0)
MCV: 86.5 fL (ref 80.0–100.0)
MPV: 11.3 fL (ref 7.5–12.5)
Monocytes Relative: 9.6 %
Neutro Abs: 3310 cells/uL (ref 1500–7800)
Neutrophils Relative %: 56.1 %
Platelets: 200 10*3/uL (ref 140–400)
RBC: 5.25 10*6/uL (ref 4.20–5.80)
RDW: 12.3 % (ref 11.0–15.0)
Total Lymphocyte: 33.3 %
WBC mixed population: 566 cells/uL (ref 200–950)
WBC: 5.9 10*3/uL (ref 3.8–10.8)

## 2017-12-25 LAB — COMPLETE METABOLIC PANEL WITH GFR
AG Ratio: 2.3 (calc) (ref 1.0–2.5)
ALT: 18 U/L (ref 9–46)
AST: 19 U/L (ref 10–35)
Albumin: 4.6 g/dL (ref 3.6–5.1)
Alkaline phosphatase (APISO): 58 U/L (ref 40–115)
BUN: 23 mg/dL (ref 7–25)
CO2: 28 mmol/L (ref 20–32)
Calcium: 9.5 mg/dL (ref 8.6–10.3)
Chloride: 103 mmol/L (ref 98–110)
Creat: 1.09 mg/dL (ref 0.70–1.33)
GFR, Est African American: 86 mL/min/{1.73_m2} (ref >=60)
GFR, Est Non African American: 74 mL/min/{1.73_m2} (ref >=60)
Globulin: 2 g/dL (calc) (ref 1.9–3.7)
Glucose, Bld: 101 mg/dL — ABNORMAL HIGH (ref 65–99)
Potassium: 4.4 mmol/L (ref 3.5–5.3)
Sodium: 139 mmol/L (ref 135–146)
Total Bilirubin: 0.9 mg/dL (ref 0.2–1.2)
Total Protein: 6.6 g/dL (ref 6.1–8.1)

## 2017-12-25 LAB — LIPID PANEL
Cholesterol: 175 mg/dL (ref <200)
HDL: 51 mg/dL (ref >40)
LDL Cholesterol (Calc): 105 mg/dL (calc) — ABNORMAL HIGH
Non-HDL Cholesterol (Calc): 124 mg/dL (calc) (ref <130)
Total CHOL/HDL Ratio: 3.4 (calc) (ref <5.0)
Triglycerides: 94 mg/dL (ref <150)

## 2017-12-25 LAB — PSA: PSA: 0.4 ng/mL (ref <=4.0)

## 2017-12-26 ENCOUNTER — Telehealth: Payer: Self-pay | Admitting: Family Medicine

## 2017-12-26 NOTE — Telephone Encounter (Signed)
Pt called and states that you wrote him a letter for more time taking test and you put in that letter 30% more time and he states they have changed the test from 100 questions to 150 questions but did not give more time to a lot for the increase in questions and he would like to know if you would change it to 50%?

## 2017-12-27 NOTE — Telephone Encounter (Signed)
No I feel a 30% increase is sufficient.

## 2017-12-28 NOTE — Telephone Encounter (Signed)
Patient aware of providers recommendations.  

## 2019-01-02 ENCOUNTER — Other Ambulatory Visit: Payer: Self-pay

## 2019-01-03 ENCOUNTER — Ambulatory Visit (INDEPENDENT_AMBULATORY_CARE_PROVIDER_SITE_OTHER): Payer: PRIVATE HEALTH INSURANCE | Admitting: Family Medicine

## 2019-01-03 ENCOUNTER — Encounter: Payer: Self-pay | Admitting: Family Medicine

## 2019-01-03 VITALS — BP 134/78 | HR 62 | Temp 97.6°F | Resp 16 | Ht 66.5 in | Wt 209.0 lb

## 2019-01-03 DIAGNOSIS — Z0000 Encounter for general adult medical examination without abnormal findings: Secondary | ICD-10-CM

## 2019-01-03 DIAGNOSIS — Z0001 Encounter for general adult medical examination with abnormal findings: Secondary | ICD-10-CM | POA: Diagnosis not present

## 2019-01-03 DIAGNOSIS — Z206 Contact with and (suspected) exposure to human immunodeficiency virus [HIV]: Secondary | ICD-10-CM

## 2019-01-03 NOTE — Progress Notes (Signed)
Subjective:    Patient ID: Jonathan Reeves, male    DOB: [birth year], 58 y.o.   MRN: [id number]  HPI Patient is a very polite 58 year old Caucasian male here today for a complete physical exam.  His last colonoscopy was 2017.  He is due again in 2022.  He is due for prostate cancer screening.  Patient is a Medical illustrator.  He is potentially exposed to blood through his employment either through accident or medical cause.  Therefore I have recommended HIV screening.  Hepatitis C test was negative in 2017.  Otherwise he has been doing well with no concerns.  His last tetanus shot was in 2016.  This is good until 2026.  He is due for a flu shot.  The patient politely declines his flu shot.  If he changes his mind he can return at any point will be glad to give it to him. Past Medical History:  Diagnosis Date  . Carpal tunnel syndrome on both sides   . Elevated uric acid in blood   . Prediabetes   . Ulcerative colitis ([place])    Dr. Amedeo Plenty   No past surgical history on file. No current outpatient medications on file prior to visit.   No current facility-administered medications on file prior to visit.    No Known Allergies Social History   Socioeconomic History  . Marital status: Married    Spouse name: Not on file  . Number of children: Not on file  . Years of education: Not on file  . Highest education level: Not on file  Occupational History  . Not on file  Social Needs  . Financial resource strain: Not on file  . Food insecurity    Worry: Not on file    Inability: Not on file  . Transportation needs    Medical: Not on file    Non-medical: Not on file  Tobacco Use  . Smoking status: Never Smoker  . Smokeless tobacco: Never Used  Substance and Sexual Activity  . Alcohol use: Yes    Alcohol/week: 1.0 standard drinks    Types: 1 Cans of beer per week    Comment: occasional  . Drug use: Not on file  . Sexual activity: Not on file  Lifestyle  . Physical  activity    Days per week: Not on file    Minutes per session: Not on file  . Stress: Not on file  Relationships  . Social Herbalist on phone: Not on file    Gets together: Not on file    Attends religious service: Not on file    Active member of club or organization: Not on file    Attends meetings of clubs or organizations: Not on file    Relationship status: Not on file  . Intimate partner violence    Fear of current or ex partner: Not on file    Emotionally abused: Not on file    Physically abused: Not on file    Forced sexual activity: Not on file  Other Topics Concern  . Not on file  Social History Narrative  . Not on file   Family History  Problem Relation Age of Onset  . Arthritis Mother   . Diabetes Father   . Alcohol abuse Father   . COPD Maternal Grandfather   . Cancer Paternal Grandfather   . Lupus Daughter       Review of Systems  All other systems reviewed  and are negative.      Objective:   Physical Exam  Constitutional: He is oriented to person, place, and time. He appears well-developed and well-nourished. No distress.  HENT:  Head: Normocephalic and atraumatic.  Right Ear: External ear normal.  Left Ear: External ear normal.  Nose: Nose normal.  Mouth/Throat: Oropharynx is clear and moist. No oropharyngeal exudate.  Eyes: Pupils are equal, round, and reactive to light. Conjunctivae and EOM are normal. Right eye exhibits no discharge. Left eye exhibits no discharge. No scleral icterus.  Neck: Normal range of motion. Neck supple. No JVD present. No tracheal deviation present. No thyromegaly present.  Cardiovascular: Normal rate, regular rhythm, normal heart sounds and intact distal pulses. Exam reveals no gallop and no friction rub.  No murmur heard. Pulmonary/Chest: Effort normal and breath sounds normal. No stridor. No respiratory distress. He has no wheezes. He has no rales. He exhibits no tenderness.  Abdominal: Soft. Bowel sounds  are normal. He exhibits no distension and no mass. There is no abdominal tenderness. There is no rebound and no guarding.  Musculoskeletal: Normal range of motion.        General: No tenderness, deformity or edema.  Lymphadenopathy:    He has no cervical adenopathy.  Neurological: He is alert and oriented to person, place, and time. He has normal reflexes. No cranial nerve deficit. He exhibits normal muscle tone. Coordination normal.  Skin: Skin is warm. No rash noted. He is not diaphoretic. No erythema. No pallor.  Psychiatric: He has a normal mood and affect. His behavior is normal. Judgment and thought content normal.  Vitals reviewed.         Assessment & Plan:  General medical exam - Plan: CBC with Differential/Platelet, COMPLETE METABOLIC PANEL WITH GFR, Lipid panel, PSA  Exposure to HIV - Plan: HIV antibody (with reflex)  Physical exam today is completely normal. He declines a flu shot. The remainder of his cancer screening is up-to-date. I will check a CBC, CMP, and a fasting lipid panel.  Due to exposure to blood at work I have also recommended an HIV screen.  Patient agrees to this as well.  Colonoscopy is up-to-date.  Screen for prostate cancer with a PSA.  Patient can return for a flu shot if he changes his mind.  Tetanus shot is up-to-date.

## 2019-01-04 LAB — LIPID PANEL
Cholesterol: 173 mg/dL (ref <200)
HDL: 49 mg/dL (ref >=40)
LDL Cholesterol (Calc): 104 mg/dL (calc) — ABNORMAL HIGH
Non-HDL Cholesterol (Calc): 124 mg/dL (calc) (ref <130)
Total CHOL/HDL Ratio: 3.5 (calc) (ref <5.0)
Triglycerides: 105 mg/dL (ref <150)

## 2019-01-04 LAB — CBC WITH DIFFERENTIAL/PLATELET
Absolute Monocytes: 582 cells/uL (ref 200–950)
Basophils Absolute: 19 cells/uL (ref 0–200)
Basophils Relative: 0.3 %
Eosinophils Absolute: 32 cells/uL (ref 15–500)
Eosinophils Relative: 0.5 %
HCT: 45.6 % (ref 38.5–50.0)
Hemoglobin: 15.6 g/dL (ref 13.2–17.1)
Lymphs Abs: 1754 cells/uL (ref 850–3900)
MCH: 29.5 pg (ref 27.0–33.0)
MCHC: 34.2 g/dL (ref 32.0–36.0)
MCV: 86.4 fL (ref 80.0–100.0)
MPV: 11.8 fL (ref 7.5–12.5)
Monocytes Relative: 9.1 %
Neutro Abs: 4013 cells/uL (ref 1500–7800)
Neutrophils Relative %: 62.7 %
Platelets: 200 10*3/uL (ref 140–400)
RBC: 5.28 10*6/uL (ref 4.20–5.80)
RDW: 12.7 % (ref 11.0–15.0)
Total Lymphocyte: 27.4 %
WBC: 6.4 10*3/uL (ref 3.8–10.8)

## 2019-01-04 LAB — HIV ANTIBODY (ROUTINE TESTING W REFLEX): HIV 1&2 Ab, 4th Generation: NONREACTIVE

## 2019-01-04 LAB — COMPLETE METABOLIC PANEL WITH GFR
AG Ratio: 2.2 (calc) (ref 1.0–2.5)
ALT: 23 U/L (ref 9–46)
AST: 20 U/L (ref 10–35)
Albumin: 4.6 g/dL (ref 3.6–5.1)
Alkaline phosphatase (APISO): 63 U/L (ref 35–144)
BUN: 22 mg/dL (ref 7–25)
CO2: 27 mmol/L (ref 20–32)
Calcium: 9.7 mg/dL (ref 8.6–10.3)
Chloride: 105 mmol/L (ref 98–110)
Creat: 1.2 mg/dL (ref 0.70–1.25)
GFR, Est African American: 76 mL/min/{1.73_m2} (ref >=60)
GFR, Est Non African American: 65 mL/min/{1.73_m2} (ref >=60)
Globulin: 2.1 g/dL (calc) (ref 1.9–3.7)
Glucose, Bld: 99 mg/dL (ref 65–99)
Potassium: 4.7 mmol/L (ref 3.5–5.3)
Sodium: 142 mmol/L (ref 135–146)
Total Bilirubin: 0.9 mg/dL (ref 0.2–1.2)
Total Protein: 6.7 g/dL (ref 6.1–8.1)

## 2019-01-04 LAB — PSA: PSA: 0.5 ng/mL (ref <=4.0)

## 2019-09-24 ENCOUNTER — Other Ambulatory Visit: Payer: Self-pay

## 2019-09-24 ENCOUNTER — Ambulatory Visit: Payer: Federal, State, Local not specified - PPO | Admitting: Physician Assistant

## 2019-09-24 ENCOUNTER — Encounter: Payer: Self-pay | Admitting: Physician Assistant

## 2019-09-24 DIAGNOSIS — B351 Tinea unguium: Secondary | ICD-10-CM | POA: Diagnosis not present

## 2019-09-24 DIAGNOSIS — D489 Neoplasm of uncertain behavior, unspecified: Secondary | ICD-10-CM

## 2019-09-24 DIAGNOSIS — Z1283 Encounter for screening for malignant neoplasm of skin: Secondary | ICD-10-CM | POA: Diagnosis not present

## 2019-09-24 DIAGNOSIS — L570 Actinic keratosis: Secondary | ICD-10-CM

## 2019-09-24 DIAGNOSIS — D485 Neoplasm of uncertain behavior of skin: Secondary | ICD-10-CM | POA: Diagnosis not present

## 2019-09-24 DIAGNOSIS — L820 Inflamed seborrheic keratosis: Secondary | ICD-10-CM | POA: Diagnosis not present

## 2019-09-24 NOTE — Patient Instructions (Signed)

## 2019-09-24 NOTE — Progress Notes (Signed)
   Follow up Visit  Subjective  Jonathan Reeves is a 60 y.o. male who presents for the following: Annual Exam. Left and right forehead can feel them and they come and go and get crusty. Spot on back that is a brown mole that seems to be growing.  dark spot on right upper arm. Toenails are thick and discolored.   Objective  Well appearing patient in no apparent distress; mood and affect are within normal limits.  All skin waist up and lower legs and feet examined.   Objective  Left Lower Back: Inflamed patch     Objective  Right Great Toe Nail Plate: Thickened, discolored toenails  Objective  Left Malar Cheek, Left Temple, Right Temple: Erythematous patches with gritty scale.  Assessment & Plan  Neoplasm of uncertain behavior Left Lower Back  Skin / nail biopsy Type of biopsy: tangential   Informed consent: discussed and consent obtained   Timeout: patient name, date of birth, surgical site, and procedure verified   Procedure prep:  Patient was prepped and draped in usual sterile fashion (Non sterile) Prep type:  Chlorhexidine Anesthesia: the lesion was anesthetized in a standard fashion   Anesthetic:  1% lidocaine w/ epinephrine 1-100,000 local infiltration Instrument used: flexible razor blade   Outcome: patient tolerated procedure well   Post-procedure details: wound care instructions given    Specimen 1 - Surgical pathology Differential Diagnosis: SCC vs BCC Check Margins: No  Onychomycosis Right Great Toe Nail Plate  If PAS is positive we discussed lamisil pills.He recently had labs done for an insurance physical that his wife will try to get to [place].  Specimen 2 - Surgical pathology Differential Diagnosis: Fungas Check Margins: No  AK (actinic keratosis) (3) Left Temple; Right Temple; Left Malar Cheek  3 sample packets of klisyri ointment given. Told to apply thin film qhs for 5 nights to the left forehead, left cheek and right forehead and temple. Warned  of worse before better. Patient will do this treatment in the fall

## 2019-09-25 ENCOUNTER — Other Ambulatory Visit: Payer: Self-pay

## 2019-09-25 ENCOUNTER — Ambulatory Visit (INDEPENDENT_AMBULATORY_CARE_PROVIDER_SITE_OTHER): Payer: Federal, State, Local not specified - PPO | Admitting: Family Medicine

## 2019-09-25 VITALS — BP 110/80 | HR 62 | Temp 98.1°F | Ht 66.0 in | Wt 205.0 lb

## 2019-09-25 DIAGNOSIS — L089 Local infection of the skin and subcutaneous tissue, unspecified: Secondary | ICD-10-CM | POA: Diagnosis not present

## 2019-09-25 DIAGNOSIS — Z9229 Personal history of other drug therapy: Secondary | ICD-10-CM

## 2019-09-25 DIAGNOSIS — Z23 Encounter for immunization: Secondary | ICD-10-CM | POA: Diagnosis not present

## 2019-09-25 DIAGNOSIS — S91339A Puncture wound without foreign body, unspecified foot, initial encounter: Secondary | ICD-10-CM

## 2019-09-25 DIAGNOSIS — S91331A Puncture wound without foreign body, right foot, initial encounter: Secondary | ICD-10-CM

## 2019-09-25 MED ORDER — CEPHALEXIN 500 MG PO CAPS: 500.0000 mg | ORAL_CAPSULE | Freq: Three times a day (TID) | ORAL | 0 refills | Status: DC

## 2019-09-25 MED ORDER — CIPROFLOXACIN HCL 500 MG PO TABS: 500.0000 mg | ORAL_TABLET | Freq: Two times a day (BID) | ORAL | 0 refills | Status: DC

## 2019-09-25 NOTE — Progress Notes (Signed)
Subjective:    Patient ID: Jonathan Reeves, male    DOB: February 20, 1961, 59 y.o.   MRN: [id number]  HPI Patient originally made the appointment for diarrhea that he has been having for more than a week.  He denies any bloody diarrhea.  He denies any fevers or chills.  He denies any abdominal pain or abdominal guarding or rebound.  He denies any travel.  He does have a remote history of ulcerative colitis.  Diarrhea sounds either viral although I cannot rule out inflammatory bowel disease.  However I am more concerned due to a puncture wound he suffered on the plantar aspect of his right foot.  He stepped on an exposed screw.  The screw was sticking out of drywall.  Approximately a half an inch was sticking out.  He stepped immediately on the screw and then withdrew his foot.  There is a visible puncture wound over the ball of his foot in the middle proximal to the third MTP joint.  The area there is swollen and very tender to the touch.  There is mild erythema.  There is no palpable fluctuance.  However the patient reports pain with ambulation beyond what 1 would expect due to the puncture wound itself.  There is no erythema extending onto the dorsum of the foot.  He has normal range of motion without pain in his toes. Past Medical History:  Diagnosis Date  . Carpal tunnel syndrome on both sides   . Elevated uric acid in blood   . Prediabetes   . Ulcerative colitis ([place])    Dr. Amedeo Plenty   No past surgical history on file. Current Outpatient Medications on File Prior to Visit  Medication Sig Dispense Refill  . CALCIUM PO Take by mouth.    . Multiple Vitamin (MULTIVITAMIN) tablet Take 1 tablet by mouth daily.    Marland Kitchen VITAMIN D PO Take by mouth.     No current facility-administered medications on file prior to visit.   No Known Allergies Social History   Socioeconomic History  . Marital status: Married    Spouse name: Not on file  . Number of children: Not on file  . Years of education: Not on file   . Highest education level: Not on file  Occupational History  . Not on file  Tobacco Use  . Smoking status: Never Smoker  . Smokeless tobacco: Never Used  Substance and Sexual Activity  . Alcohol use: Yes    Alcohol/week: 1.0 standard drink    Types: 1 Cans of beer per week    Comment: occasional  . Drug use: Not on file  . Sexual activity: Not on file  Other Topics Concern  . Not on file  Social History Narrative  . Not on file   Social Determinants of Health   Financial Resource Strain:   . Difficulty of Paying Living Expenses:   Food Insecurity:   . Worried About Charity fundraiser in the Last Year:   . Arboriculturist in the Last Year:   Transportation Needs:   . Film/video editor (Medical):   Marland Kitchen Lack of Transportation (Non-Medical):   Physical Activity:   . Days of Exercise per Week:   . Minutes of Exercise per Session:   Stress:   . Feeling of Stress :   Social Connections:   . Frequency of Communication with Friends and Family:   . Frequency of Social Gatherings with Friends and Family:   . Attends Religious  Services:   . Active Member of Clubs or Organizations:   . Attends Archivist Meetings:   Marland Kitchen Marital Status:   Intimate Partner Violence:   . Fear of Current or Ex-Partner:   . Emotionally Abused:   Marland Kitchen Physically Abused:   . Sexually Abused:       Review of Systems  All other systems reviewed and are negative.      Objective:   Physical Exam Vitals reviewed.  Constitutional:      Appearance: Normal appearance.  Cardiovascular:     Rate and Rhythm: Normal rate and regular rhythm.     Heart sounds: Normal heart sounds.  Pulmonary:     Effort: Pulmonary effort is normal.     Breath sounds: Normal breath sounds.  Abdominal:     General: Abdomen is flat. Bowel sounds are normal.     Palpations: Abdomen is soft.     Tenderness: There is no guarding or rebound.  Musculoskeletal:       Feet:  Neurological:     Mental Status:  He is alert.           Assessment & Plan:  Infected puncture wound of plantar aspect of foot, initial encounter  Given the pain, I am concerned about a soft tissue infection.  Patient received his tetanus booster today.  I will treat the patient with Keflex 500 mg p.o. 3 times daily for 7 days to cover staph.  I will use Cipro 500 mg p.o. twice daily to cover Pseudomonas since the puncture wound penetrated the sole of the shoe.  Reassess in 24 to 48 hours.  If worsening, the area may need surgical exploration to rule out deep space infection.  Patient will contact me immediately if it is worsening.  I will treat his diarrhea symptomatically with Imodium.  Most likely this is viral although I cannot rule out inflammatory bowel disease.  If diarrhea is worsening, I would recommend GI consultation given his history of ulcerative colitis

## 2019-09-25 NOTE — Addendum Note (Signed)
Addended by: Saundra Shelling on: 09/25/2019 10:03 AM   Modules accepted: Orders

## 2019-09-25 NOTE — Addendum Note (Signed)
Addended by: Saundra Shelling on: 09/25/2019 10:10 AM   Modules accepted: Orders

## 2019-09-26 ENCOUNTER — Telehealth: Payer: Self-pay

## 2019-09-26 NOTE — Telephone Encounter (Signed)
Patient returned call and made aware.   Reports that infection does not look like it is worsening, but does not look to be improving either.   Advised to F/U on Monday via telephone as to status.

## 2019-09-26 NOTE — Telephone Encounter (Signed)
Jonathan Reeves called he still has swollen foot, it is still sore like it was on yesterday 09/25/2019 He wants to know if you would like to see him or just let it ride out for the weekend? He's only following your orders that you gave him.

## 2019-09-26 NOTE — Telephone Encounter (Signed)
Call placed to patient. LMTRC.  

## 2019-09-26 NOTE — Telephone Encounter (Signed)
If not worse, I would give abx more time to take affect and recheck next week.  If worsening, needs surgical exploration to rule out deep space infection.

## 2019-09-29 ENCOUNTER — Ambulatory Visit: Payer: Federal, State, Local not specified - PPO | Admitting: Nurse Practitioner

## 2019-09-29 ENCOUNTER — Ambulatory Visit: Payer: Federal, State, Local not specified - PPO | Admitting: Podiatry

## 2019-09-29 ENCOUNTER — Other Ambulatory Visit: Payer: Self-pay

## 2019-09-29 ENCOUNTER — Telehealth: Payer: Self-pay

## 2019-09-29 ENCOUNTER — Encounter: Payer: Self-pay | Admitting: Podiatry

## 2019-09-29 DIAGNOSIS — S91331A Puncture wound without foreign body, right foot, initial encounter: Secondary | ICD-10-CM

## 2019-09-29 DIAGNOSIS — L0291 Cutaneous abscess, unspecified: Secondary | ICD-10-CM

## 2019-09-29 NOTE — Progress Notes (Signed)
  Subjective:  Patient ID: Jonathan Reeves, male    DOB: 07/15/1960,  MRN: [id number]  Chief Complaint  Patient presents with  . Foot Problem    Stepped on screw- went through shoe- pt states he is still having pain and discomfort     59 y.o. male presents with the above complaint. History confirmed with patient.  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  He went through his shoe.  He saw his PCP who placed him on Keflex and Levaquin.  He saw him again today and they referred him to [place] for urgent evaluation.  Objective:  Physical Exam: warm, good capillary refill, no trophic changes or ulcerative lesions, normal DP and PT pulses and normal sensory exam.  Right Foot: Puncture wound site present and healing well ball of foot submet 3-4, tenderness to palpation, mild erythema dorsal forefoot  Radiographs: X-ray of the right foot: No retained foreign body noted, no fracture, no soft tissue emphysema noted Assessment:   1. Puncture wound of right foot, initial encounter      Plan:  Patient was evaluated and treated and all questions answered.  -His puncture wound is healing, and there is no evidence of retained foreign body on x-ray. -Continue antibiotics for 1 additional week -Laboratory work to rule out deep infection has been ordered.  He will get this today -If he is not improving, or there is concerning markers on lab work we will order an ultrasound to evaluate a deep space infection for operative planning -Return in 2 weeks after ultrasound   Lanae Crumbly, DPM 09/29/2019     Return in about 2 weeks (around 10/13/2019) for after lab work to review.

## 2019-09-29 NOTE — Telephone Encounter (Signed)
His foot still has swelling, red on top and is definitely sore. Didn't know if he needed to come back in?

## 2019-09-29 NOTE — Telephone Encounter (Signed)
Definitely, may need surgical exploration for abscess.

## 2019-09-29 NOTE — Progress Notes (Signed)
Subjective:    Patient ID: Jonathan Reeves, male    DOB: 02-23-1961, 59 y.o.   MRN: [id number]  HPI  09/25/19 Patient originally made the appointment for diarrhea that he has been having for more than a week.  He denies any bloody diarrhea.  He denies any fevers or chills.  He denies any abdominal pain or abdominal guarding or rebound.  He denies any travel.  He does have a remote history of ulcerative colitis.  Diarrhea sounds either viral although I cannot rule out inflammatory bowel disease.  However I am more concerned due to a puncture wound he suffered on the plantar aspect of his right foot.  He stepped on an exposed screw.  The screw was sticking out of drywall.  Approximately a half an inch was sticking out.  He stepped immediately on the screw and then withdrew his foot.  There is a visible puncture wound over the ball of his foot in the middle proximal to the third MTP joint.  The area there is swollen and very tender to the touch.  There is mild erythema.  There is no palpable fluctuance.  However the patient reports pain with ambulation beyond what 1 would expect due to the puncture wound itself.  There is no erythema extending onto the dorsum of the foot.  He has normal range of motion without pain in his toes. At that time, my plan was: Given the pain, I am concerned about a soft tissue infection.  Patient received his tetanus booster today.  I will treat the patient with Keflex 500 mg p.o. 3 times daily for 7 days to cover staph.  I will use Cipro 500 mg p.o. twice daily to cover Pseudomonas since the puncture wound penetrated the sole of the shoe.  Reassess in 24 to 48 hours.  If worsening, the area may need surgical exploration to rule out deep space infection.  Patient will contact me immediately if it is worsening.  I will treat his diarrhea symptomatically with Imodium.  Most likely this is viral although I cannot rule out inflammatory bowel disease.  If diarrhea is worsening, I would  recommend GI consultation given his history of ulcerative colitis  09/29/19 Patient was seen today in consultation with my nurse practitioner.  He has been on Cipro to cover Pseudomonas and Keflex to cover staph for 4 days over the weekend.  Despite that the pain is still present in his foot and slightly worse.  There is now erythema on the dorsum of the foot at the base of the second third and fourth metatarsals.  There is swelling on the plantar aspect of the foot and extreme tenderness to palpation near the puncture wound.  This is all concerning for possibly a deep space infection.  The erythema is certainly new since last week and was not present previously.  Patient states the pain is no better Past Medical History:  Diagnosis Date  . Carpal tunnel syndrome on both sides   . Elevated uric acid in blood   . Prediabetes   . Ulcerative colitis ([place])    Dr. Amedeo Plenty   No past surgical history on file. Current Outpatient Medications on File Prior to Visit  Medication Sig Dispense Refill  . CALCIUM PO Take by mouth.    . cephALEXin (KEFLEX) 500 MG capsule Take 1 capsule (500 mg total) by mouth 3 (three) times daily. 21 capsule 0  . ciprofloxacin (CIPRO) 500 MG tablet Take 1 tablet (500 mg total)  by mouth 2 (two) times daily. 14 tablet 0  . Multiple Vitamin (MULTIVITAMIN) tablet Take 1 tablet by mouth daily.    Marland Kitchen VITAMIN D PO Take by mouth.     No current facility-administered medications on file prior to visit.   No Known Allergies Social History   Socioeconomic History  . Marital status: Married    Spouse name: Not on file  . Number of children: Not on file  . Years of education: Not on file  . Highest education level: Not on file  Occupational History  . Not on file  Tobacco Use  . Smoking status: Never Smoker  . Smokeless tobacco: Never Used  Substance and Sexual Activity  . Alcohol use: Yes    Alcohol/week: 1.0 standard drink    Types: 1 Cans of beer per week    Comment:  occasional  . Drug use: Not on file  . Sexual activity: Not on file  Other Topics Concern  . Not on file  Social History Narrative  . Not on file   Social Determinants of Health   Financial Resource Strain:   . Difficulty of Paying Living Expenses:   Food Insecurity:   . Worried About Charity fundraiser in the Last Year:   . Arboriculturist in the Last Year:   Transportation Needs:   . Film/video editor (Medical):   Marland Kitchen Lack of Transportation (Non-Medical):   Physical Activity:   . Days of Exercise per Week:   . Minutes of Exercise per Session:   Stress:   . Feeling of Stress :   Social Connections:   . Frequency of Communication with Friends and Family:   . Frequency of Social Gatherings with Friends and Family:   . Attends Religious Services:   . Active Member of Clubs or Organizations:   . Attends Archivist Meetings:   Marland Kitchen Marital Status:   Intimate Partner Violence:   . Fear of Current or Ex-Partner:   . Emotionally Abused:   Marland Kitchen Physically Abused:   . Sexually Abused:       Review of Systems  All other systems reviewed and are negative.      Objective:   Physical Exam Vitals reviewed.  Constitutional:      Appearance: Normal appearance.  Cardiovascular:     Rate and Rhythm: Normal rate and regular rhythm.     Heart sounds: Normal heart sounds.  Pulmonary:     Effort: Pulmonary effort is normal.     Breath sounds: Normal breath sounds.  Abdominal:     General: Abdomen is flat. Bowel sounds are normal.     Palpations: Abdomen is soft.     Tenderness: There is no guarding or rebound.  Musculoskeletal:       Feet:  Neurological:     Mental Status: He is alert.           Assessment & Plan:  Abscess - Plan: Ambulatory referral to Podiatry Patient has developed erythema on the dorsum of his foot since I saw him last despite being on Cipro and Keflex.  He has persistent pain that is actually worsening and tenderness to palpation at the  puncture wound.  I believe this patient may have a deep space infection that requires incision and drainage.  Given the potential depth of the infection, I have recommended a podiatry consultation To determine if surgical exploration is necessary.  Podiatry has graciously agreed to work the patient in 4:00 this  afternoon.  I truly appreciate this.  The patient will go directly there.  He will not be charged for this office visit

## 2019-09-30 ENCOUNTER — Other Ambulatory Visit: Payer: Self-pay | Admitting: Podiatry

## 2019-09-30 DIAGNOSIS — S91331A Puncture wound without foreign body, right foot, initial encounter: Secondary | ICD-10-CM | POA: Diagnosis not present

## 2019-10-01 LAB — CBC WITH DIFFERENTIAL/PLATELET
Basophils Absolute: 0 10*3/uL (ref 0.0–0.2)
Basos: 1 %
EOS (ABSOLUTE): 0.1 10*3/uL (ref 0.0–0.4)
Eos: 1 %
Hematocrit: 41.7 % (ref 37.5–51.0)
Hemoglobin: 14.3 g/dL (ref 13.0–17.7)
Immature Grans (Abs): 0.2 10*3/uL — ABNORMAL HIGH (ref 0.0–0.1)
Immature Granulocytes: 2 %
Lymphocytes Absolute: 1.9 10*3/uL (ref 0.7–3.1)
Lymphs: 24 %
MCH: 29.8 pg (ref 26.6–33.0)
MCHC: 34.3 g/dL (ref 31.5–35.7)
MCV: 87 fL (ref 79–97)
Monocytes Absolute: 0.8 10*3/uL (ref 0.1–0.9)
Monocytes: 10 %
Neutrophils Absolute: 4.9 10*3/uL (ref 1.4–7.0)
Neutrophils: 62 %
Platelets: 221 10*3/uL (ref 150–450)
RBC: 4.8 x10E6/uL (ref 4.14–5.80)
RDW: 12.4 % (ref 11.6–15.4)
WBC: 7.8 10*3/uL (ref 3.4–10.8)

## 2019-10-01 LAB — C-REACTIVE PROTEIN: CRP: 9 mg/L (ref 0–10)

## 2019-10-01 LAB — SEDIMENTATION RATE: Sed Rate: 9 mm/hr (ref 0–30)

## 2019-10-13 ENCOUNTER — Telehealth: Payer: Self-pay | Admitting: Podiatry

## 2019-10-13 NOTE — Telephone Encounter (Signed)
Pt called to see about blood test results

## 2019-10-13 NOTE — Telephone Encounter (Signed)
Reviewed lab work with patient, no signs of deep infection. Still having some discomfort and general foot issues, will have the office call to schedule f/u appointment.  Lanae Crumbly, DPM 10/13/2019

## 2019-11-13 DIAGNOSIS — K51319 Ulcerative (chronic) rectosigmoiditis with unspecified complications: Secondary | ICD-10-CM | POA: Diagnosis not present

## 2019-11-20 ENCOUNTER — Other Ambulatory Visit: Payer: Self-pay | Admitting: *Deleted

## 2019-11-20 DIAGNOSIS — B351 Tinea unguium: Secondary | ICD-10-CM

## 2019-11-27 DIAGNOSIS — B351 Tinea unguium: Secondary | ICD-10-CM | POA: Diagnosis not present

## 2019-11-28 LAB — COMPREHENSIVE METABOLIC PANEL
AG Ratio: 2.1 (calc) (ref 1.0–2.5)
ALT: 21 U/L (ref 9–46)
AST: 20 U/L (ref 10–35)
Albumin: 4.6 g/dL (ref 3.6–5.1)
Alkaline phosphatase (APISO): 65 U/L (ref 35–144)
BUN: 19 mg/dL (ref 7–25)
CO2: 33 mmol/L — ABNORMAL HIGH (ref 20–32)
Calcium: 9.7 mg/dL (ref 8.6–10.3)
Chloride: 102 mmol/L (ref 98–110)
Creat: 1.14 mg/dL (ref 0.70–1.25)
Globulin: 2.2 g/dL (calc) (ref 1.9–3.7)
Glucose, Bld: 89 mg/dL (ref 65–99)
Potassium: 4.6 mmol/L (ref 3.5–5.3)
Sodium: 140 mmol/L (ref 135–146)
Total Bilirubin: 0.9 mg/dL (ref 0.2–1.2)
Total Protein: 6.8 g/dL (ref 6.1–8.1)

## 2019-12-01 ENCOUNTER — Telehealth: Payer: Self-pay | Admitting: *Deleted

## 2019-12-01 MED ORDER — TERBINAFINE HCL 250 MG PO TABS: 250.0000 mg | ORAL_TABLET | Freq: Every day | ORAL | 1 refills | Status: DC

## 2019-12-01 NOTE — Telephone Encounter (Signed)
Labs to patient, sent lamisil to [facility] and 6 week follow up visit made with Dr Denna Haggard

## 2019-12-01 NOTE — Telephone Encounter (Signed)
-----   Message from Arlyss Gandy, [place] sent at 11/28/2019 10:15 AM EDT ----- Can start lamisil 250 mg #30 with 1 rf. Will need a visit and labs repeated in 6 weeks.

## 2020-01-15 ENCOUNTER — Ambulatory Visit: Payer: Federal, State, Local not specified - PPO | Admitting: Dermatology

## 2020-01-15 ENCOUNTER — Encounter: Payer: Self-pay | Admitting: Dermatology

## 2020-01-15 ENCOUNTER — Other Ambulatory Visit: Payer: Self-pay

## 2020-01-15 DIAGNOSIS — B351 Tinea unguium: Secondary | ICD-10-CM | POA: Diagnosis not present

## 2020-01-15 MED ORDER — TERBINAFINE HCL 250 MG PO TABS: 250.0000 mg | ORAL_TABLET | Freq: Every day | ORAL | 1 refills | Status: DC

## 2020-01-19 ENCOUNTER — Encounter: Payer: Self-pay | Admitting: Dermatology

## 2020-01-19 NOTE — Progress Notes (Signed)
   Follow-Up Visit   Subjective  Jonathan Reeves is a 59 y.o. male who presents for the following: Follow-up (toe fungus- "hasnt really paid attention to see if its better or not " tx- terbinafine 250 mg).  Tinea feet Location:  Duration:  Quality:  Associated Signs/Symptoms: Modifying Factors: Oral terbinafine Severity:  Timing: Context:   Objective  Well appearing patient in no apparent distress; mood and affect are within normal limits.  A focused examination was performed including Hands, feet, nails.. Relevant physical exam findings are noted in the Assessment and Plan.   Assessment & Plan    Onychomycosis Right Hallux Toe Nail Plate  We will check labs to make sure it safe to continue the terbinafine.  Target total dosage 3 to 4 months.  CBC with Differential/Platelet - Right Hallux Toe Nail Plate  Comprehensive metabolic panel - Right Hallux Toe Nail Plate  Reordered Medications terbinafine (LAMISIL) 250 MG tablet     I, Lavonna Monarch, MD, have reviewed all documentation for this visit.  The documentation on 01/19/20 for the exam, diagnosis, procedures, and orders are all accurate and complete.

## 2020-01-20 ENCOUNTER — Telehealth: Payer: Self-pay | Admitting: Family Medicine

## 2020-01-20 NOTE — Telephone Encounter (Signed)
CB# [phone] Pt has appt this Friday for his stomach not able to keep food down is anything can do prior to his appt

## 2020-01-20 NOTE — Telephone Encounter (Signed)
Spoke with Pt suggesting some fluids and foods Pt could try to settle his stomach

## 2020-01-22 ENCOUNTER — Ambulatory Visit (INDEPENDENT_AMBULATORY_CARE_PROVIDER_SITE_OTHER): Payer: Federal, State, Local not specified - PPO | Admitting: Family Medicine

## 2020-01-22 ENCOUNTER — Other Ambulatory Visit: Payer: Self-pay

## 2020-01-22 VITALS — BP 120/70 | HR 63 | Temp 97.7°F | Ht 66.0 in | Wt 196.0 lb

## 2020-01-22 DIAGNOSIS — A09 Infectious gastroenteritis and colitis, unspecified: Secondary | ICD-10-CM

## 2020-01-22 MED ORDER — CIPROFLOXACIN HCL 500 MG PO TABS: 500.0000 mg | ORAL_TABLET | Freq: Two times a day (BID) | ORAL | 0 refills | Status: DC

## 2020-01-22 MED ORDER — DIPHENOXYLATE-ATROPINE 2.5-0.025 MG PO TABS: 2.0000 | ORAL_TABLET | Freq: Four times a day (QID) | ORAL | 0 refills | Status: DC | PRN

## 2020-01-22 MED ORDER — METRONIDAZOLE 500 MG PO TABS: 500.0000 mg | ORAL_TABLET | Freq: Two times a day (BID) | ORAL | 0 refills | Status: DC

## 2020-01-22 NOTE — Progress Notes (Signed)
Subjective:    Patient ID: Jonathan Reeves, male    DOB: 03-Nov-1960, 59 y.o.   MRN: [id number]  HPI Patient is a very pleasant 59 year old Caucasian male who presents today with abdominal discomfort and diarrhea.  Symptoms began in earnest on Sunday.  He had a high fever, crampy abdominal pain, and copious watery diarrhea.  The fever persisted through Tuesday and Wednesday.  By the end of Wednesday the fever had subsided and he was feeling much better.  However he has continued to have crampy abdominal pain and watery diarrhea through Wednesday and Thursday.  He denies any nausea today.  He denies any vomiting.  He denies any melena or hematochezia.  He had a negative Covid test earlier this week.  He denies any sick contacts.  He denies any recent travel.  He denies any antibiotic use. Past Medical History:  Diagnosis Date  . Carpal tunnel syndrome on both sides   . Elevated uric acid in blood   . Prediabetes   . Ulcerative colitis ([place])    Dr. Amedeo Plenty   No past surgical history on file. Current Outpatient Medications on File Prior to Visit  Medication Sig Dispense Refill  . CALCIUM PO Take by mouth.    . Multiple Vitamin (MULTIVITAMIN) tablet Take 1 tablet by mouth daily.    Marland Kitchen terbinafine (LAMISIL) 250 MG tablet Take 1 tablet (250 mg total) by mouth daily. 30 tablet 1  . VITAMIN D PO Take by mouth.    . zinc gluconate 50 MG tablet Take 50 mg by mouth daily.    . cephALEXin (KEFLEX) 500 MG capsule Take 1 capsule (500 mg total) by mouth 3 (three) times daily. (Patient not taking: Reported on 01/15/2020) 21 capsule 0   No current facility-administered medications on file prior to visit.   No Known Allergies Social History   Socioeconomic History  . Marital status: Married    Spouse name: Not on file  . Number of children: Not on file  . Years of education: Not on file  . Highest education level: Not on file  Occupational History  . Not on file  Tobacco Use  . Smoking status: Never  Smoker  . Smokeless tobacco: Never Used  Substance and Sexual Activity  . Alcohol use: Yes    Alcohol/week: 1.0 standard drink    Types: 1 Cans of beer per week    Comment: occasional  . Drug use: Not on file  . Sexual activity: Not on file  Other Topics Concern  . Not on file  Social History Narrative  . Not on file   Social Determinants of Health   Financial Resource Strain:   . Difficulty of Paying Living Expenses: Not on file  Food Insecurity:   . Worried About Charity fundraiser in the Last Year: Not on file  . Ran Out of Food in the Last Year: Not on file  Transportation Needs:   . Lack of Transportation (Medical): Not on file  . Lack of Transportation (Non-Medical): Not on file  Physical Activity:   . Days of Exercise per Week: Not on file  . Minutes of Exercise per Session: Not on file  Stress:   . Feeling of Stress : Not on file  Social Connections:   . Frequency of Communication with Friends and Family: Not on file  . Frequency of Social Gatherings with Friends and Family: Not on file  . Attends Religious Services: Not on file  . Active Member of  Clubs or Organizations: Not on file  . Attends Archivist Meetings: Not on file  . Marital Status: Not on file  Intimate Partner Violence:   . Fear of Current or Ex-Partner: Not on file  . Emotionally Abused: Not on file  . Physically Abused: Not on file  . Sexually Abused: Not on file   Family History  Problem Relation Age of Onset  . Arthritis Mother   . Diabetes Father   . Alcohol abuse Father   . COPD Maternal Grandfather   . Cancer Paternal Grandfather   . Lupus Daughter       Review of Systems  Gastrointestinal: Positive for abdominal pain.  All other systems reviewed and are negative.      Objective:   Physical Exam Vitals reviewed.  Constitutional:      General: He is not in acute distress.    Appearance: He is well-developed. He is not diaphoretic.  HENT:     Head: Normocephalic  and atraumatic.     Right Ear: External ear normal.     Left Ear: External ear normal.     Nose: Nose normal.     Mouth/Throat:     Pharynx: No oropharyngeal exudate.  Eyes:     General: No scleral icterus.       Right eye: No discharge.        Left eye: No discharge.     Conjunctiva/sclera: Conjunctivae normal.     Pupils: Pupils are equal, round, and reactive to light.  Neck:     Thyroid: No thyromegaly.     Vascular: No JVD.     Trachea: No tracheal deviation.  Cardiovascular:     Rate and Rhythm: Normal rate and regular rhythm.     Heart sounds: Normal heart sounds. No murmur heard.  No friction rub. No gallop.   Pulmonary:     Effort: Pulmonary effort is normal. No respiratory distress.     Breath sounds: Normal breath sounds. No stridor. No wheezing or rales.  Chest:     Chest wall: No tenderness.  Abdominal:     General: Bowel sounds are normal. There is no distension.     Palpations: Abdomen is soft. There is no mass.     Tenderness: There is no abdominal tenderness. There is no guarding or rebound.  Musculoskeletal:        General: No tenderness or deformity. Normal range of motion.     Cervical back: Normal range of motion and neck supple.  Lymphadenopathy:     Cervical: No cervical adenopathy.  Skin:    General: Skin is warm.     Findings: No erythema or rash.  Neurological:     Mental Status: He is alert and oriented to person, place, and time.     Cranial Nerves: No cranial nerve deficit.     Motor: No abnormal muscle tone.     Coordination: Coordination normal.     Deep Tendon Reflexes: Reflexes are normal and symmetric.  Psychiatric:        Behavior: Behavior normal.        Thought Content: Thought content normal.        Judgment: Judgment normal.           Assessment & Plan:  Diarrhea of infectious origin  Symptoms sound consistent with a viral gastroenteritis or some type of viral diarrhea.  Patient seems to be pulling through.  Has been  afebrile for 2 days and the diarrhea is  starting to improve.  Therefore I recommended tincture of time.  I recommended rest, bland diet, pushing fluids such as Gatorade, and eating bland items such as chicken noodle soup etc.  He can use Lomotil 2 tablets every 6 hours as needed for diarrhea.  If he develops severe left-sided abdominal pain, I want him to start Cipro and Flagyl for possible diverticulitis and discontinue Lomotil.  However otherwise I will treat this as viral gastroenteritis with supportive care only.

## 2020-01-28 DIAGNOSIS — R109 Unspecified abdominal pain: Secondary | ICD-10-CM | POA: Diagnosis not present

## 2020-01-28 DIAGNOSIS — R197 Diarrhea, unspecified: Secondary | ICD-10-CM | POA: Diagnosis not present

## 2020-02-02 DIAGNOSIS — R197 Diarrhea, unspecified: Secondary | ICD-10-CM | POA: Diagnosis not present

## 2020-02-02 DIAGNOSIS — K51319 Ulcerative (chronic) rectosigmoiditis with unspecified complications: Secondary | ICD-10-CM | POA: Diagnosis not present

## 2020-02-19 ENCOUNTER — Encounter: Payer: Federal, State, Local not specified - PPO | Admitting: Family Medicine

## 2020-03-30 DIAGNOSIS — Z01812 Encounter for preprocedural laboratory examination: Secondary | ICD-10-CM | POA: Diagnosis not present

## 2020-04-06 ENCOUNTER — Ambulatory Visit (INDEPENDENT_AMBULATORY_CARE_PROVIDER_SITE_OTHER): Payer: Federal, State, Local not specified - PPO | Admitting: Family Medicine

## 2020-04-06 ENCOUNTER — Other Ambulatory Visit: Payer: Self-pay

## 2020-04-06 ENCOUNTER — Encounter: Payer: Self-pay | Admitting: Family Medicine

## 2020-04-06 VITALS — BP 138/82 | HR 64 | Temp 98.5°F | Wt 206.0 lb

## 2020-04-06 DIAGNOSIS — Z0000 Encounter for general adult medical examination without abnormal findings: Secondary | ICD-10-CM

## 2020-04-06 NOTE — Progress Notes (Signed)
Subjective:    Patient ID: Jonathan Reeves, male    DOB: 15-May-1960, 60 y.o.   MRN: [id number]  HPI Patient is a very pleasant 60 year old Caucasian male here today for complete physical exam.  He recently had COVID about a month ago.  Therefore he politely declines the Covid vaccine as he believes that he has adequate natural immunity.  He also politely declines a flu shot.  He is due for the shingles vaccine and we discussed this at length today and I did recommend that.  He is due for a colonoscopy however he had to reschedule his colonoscopy because he had COVID recently.  He is now scheduled for May.  He is also due for prostate cancer screening with a PSA.  His tetanus shot is up-to-date.  Otherwise he has been doing well with no concerns Past Medical History:  Diagnosis Date  . Carpal tunnel syndrome on both sides   . Elevated uric acid in blood   . Prediabetes   . Ulcerative colitis ([place])    Dr. Amedeo Plenty   No past surgical history on file. Current Outpatient Medications on File Prior to Visit  Medication Sig Dispense Refill  . terbinafine (LAMISIL) 250 MG tablet Take 1 tablet (250 mg total) by mouth daily. 30 tablet 1  . CALCIUM PO Take by mouth.    . Multiple Vitamin (MULTIVITAMIN) tablet Take 1 tablet by mouth daily.    Marland Kitchen VITAMIN D PO Take by mouth.    . zinc gluconate 50 MG tablet Take 50 mg by mouth daily.     No current facility-administered medications on file prior to visit.   No Known Allergies Social History   Socioeconomic History  . Marital status: Married    Spouse name: Not on file  . Number of children: Not on file  . Years of education: Not on file  . Highest education level: Not on file  Occupational History  . Not on file  Tobacco Use  . Smoking status: Never Smoker  . Smokeless tobacco: Never Used  Substance and Sexual Activity  . Alcohol use: Yes    Alcohol/week: 1.0 standard drink    Types: 1 Cans of beer per week    Comment: occasional  . Drug  use: Not on file  . Sexual activity: Not on file  Other Topics Concern  . Not on file  Social History Narrative  . Not on file   Social Determinants of Health   Financial Resource Strain: Not on file  Food Insecurity: Not on file  Transportation Needs: Not on file  Physical Activity: Not on file  Stress: Not on file  Social Connections: Not on file  Intimate Partner Violence: Not on file   Family History  Problem Relation Age of Onset  . Arthritis Mother   . Diabetes Father   . Alcohol abuse Father   . COPD Maternal Grandfather   . Cancer Paternal Grandfather   . Lupus Daughter       Review of Systems  All other systems reviewed and are negative.      Objective:   Physical Exam Vitals reviewed.  Constitutional:      General: He is not in acute distress.    Appearance: He is well-developed. He is not diaphoretic.  HENT:     Head: Normocephalic and atraumatic.     Right Ear: External ear normal. There is impacted cerumen.     Left Ear: External ear normal. There is impacted cerumen.  Nose: Nose normal. No congestion.     Mouth/Throat:     Mouth: Mucous membranes are moist.     Pharynx: Oropharynx is clear. No oropharyngeal exudate or posterior oropharyngeal erythema.  Eyes:     General: No scleral icterus.       Right eye: No discharge.        Left eye: No discharge.     Extraocular Movements: Extraocular movements intact.     Conjunctiva/sclera: Conjunctivae normal.     Pupils: Pupils are equal, round, and reactive to light.  Neck:     Thyroid: No thyromegaly.     Vascular: No JVD.     Trachea: No tracheal deviation.  Cardiovascular:     Rate and Rhythm: Normal rate and regular rhythm.     Heart sounds: Normal heart sounds. No murmur heard. No friction rub. No gallop.   Pulmonary:     Effort: Pulmonary effort is normal. No respiratory distress.     Breath sounds: Normal breath sounds. No stridor. No wheezing, rhonchi or rales.  Chest:     Chest  wall: No tenderness.  Abdominal:     General: Bowel sounds are normal. There is no distension.     Palpations: Abdomen is soft. There is no mass.     Tenderness: There is no abdominal tenderness. There is no guarding or rebound.  Genitourinary:    Penis: Normal.      Testes: Normal.  Musculoskeletal:        General: No tenderness or deformity. Normal range of motion.     Cervical back: Normal range of motion and neck supple.  Lymphadenopathy:     Cervical: No cervical adenopathy.  Skin:    General: Skin is warm.     Coloration: Skin is not pale.     Findings: No erythema or rash.  Neurological:     General: No focal deficit present.     Mental Status: He is alert and oriented to person, place, and time.     Cranial Nerves: No cranial nerve deficit.     Sensory: No sensory deficit.     Motor: No weakness or abnormal muscle tone.     Coordination: Coordination normal.     Gait: Gait normal.     Deep Tendon Reflexes: Reflexes are normal and symmetric. Reflexes normal.  Psychiatric:        Mood and Affect: Mood normal.        Behavior: Behavior normal.        Thought Content: Thought content normal.        Judgment: Judgment normal.           Assessment & Plan:  General medical exam - Plan: CBC with Differential/Platelet, COMPLETE METABOLIC PANEL WITH GFR, Lipid panel, PSA  Physical exam today is completely normal.  He politely declines a flu shot as well as the Covid vaccine due to natural immunity.  He will consider the shingles vaccine.  Colonoscopy has been scheduled for May.  Screen for prostate cancer with a PSA.  Check CBC, CMP, fasting lipid panel along with a PSA.  Blood pressure today is borderline.  Of asked the patient to start checking his blood pressure more frequently to ensure that his blood pressure is well controlled at home.

## 2020-04-07 LAB — CBC WITH DIFFERENTIAL/PLATELET
Absolute Monocytes: 526 cells/uL (ref 200–950)
Basophils Absolute: 28 cells/uL (ref 0–200)
Basophils Relative: 0.5 %
Eosinophils Absolute: 28 cells/uL (ref 15–500)
Eosinophils Relative: 0.5 %
HCT: 43.2 % (ref 38.5–50.0)
Hemoglobin: 14.9 g/dL (ref 13.2–17.1)
Lymphs Abs: 1971 cells/uL (ref 850–3900)
MCH: 29.8 pg (ref 27.0–33.0)
MCHC: 34.5 g/dL (ref 32.0–36.0)
MCV: 86.4 fL (ref 80.0–100.0)
MPV: 11.2 fL (ref 7.5–12.5)
Monocytes Relative: 9.4 %
Neutro Abs: 3046 cells/uL (ref 1500–7800)
Neutrophils Relative %: 54.4 %
Platelets: 217 10*3/uL (ref 140–400)
RBC: 5 10*6/uL (ref 4.20–5.80)
RDW: 12.8 % (ref 11.0–15.0)
Total Lymphocyte: 35.2 %
WBC: 5.6 10*3/uL (ref 3.8–10.8)

## 2020-04-07 LAB — COMPLETE METABOLIC PANEL WITH GFR
AG Ratio: 2 (calc) (ref 1.0–2.5)
ALT: 23 U/L (ref 9–46)
AST: 20 U/L (ref 10–35)
Albumin: 4.5 g/dL (ref 3.6–5.1)
Alkaline phosphatase (APISO): 73 U/L (ref 35–144)
BUN: 23 mg/dL (ref 7–25)
CO2: 28 mmol/L (ref 20–32)
Calcium: 9.8 mg/dL (ref 8.6–10.3)
Chloride: 104 mmol/L (ref 98–110)
Creat: 1.1 mg/dL (ref 0.70–1.25)
GFR, Est African American: 84 mL/min/{1.73_m2} (ref >=60)
GFR, Est Non African American: 72 mL/min/{1.73_m2} (ref >=60)
Globulin: 2.3 g/dL (calc) (ref 1.9–3.7)
Glucose, Bld: 102 mg/dL — ABNORMAL HIGH (ref 65–99)
Potassium: 4.5 mmol/L (ref 3.5–5.3)
Sodium: 141 mmol/L (ref 135–146)
Total Bilirubin: 0.5 mg/dL (ref 0.2–1.2)
Total Protein: 6.8 g/dL (ref 6.1–8.1)

## 2020-04-07 LAB — LIPID PANEL
Cholesterol: 169 mg/dL (ref <200)
HDL: 51 mg/dL (ref >=40)
LDL Cholesterol (Calc): 99 mg/dL (calc)
Non-HDL Cholesterol (Calc): 118 mg/dL (calc) (ref <130)
Total CHOL/HDL Ratio: 3.3 (calc) (ref <5.0)
Triglycerides: 96 mg/dL (ref <150)

## 2020-04-07 LAB — PSA: PSA: 0.69 ng/mL (ref <=4.0)

## 2020-07-09 DIAGNOSIS — D122 Benign neoplasm of ascending colon: Secondary | ICD-10-CM | POA: Diagnosis not present

## 2020-07-09 DIAGNOSIS — K5150 Left sided colitis without complications: Secondary | ICD-10-CM | POA: Diagnosis not present

## 2020-07-09 DIAGNOSIS — K5289 Other specified noninfective gastroenteritis and colitis: Secondary | ICD-10-CM | POA: Diagnosis not present

## 2021-06-07 ENCOUNTER — Ambulatory Visit: Payer: Federal, State, Local not specified - PPO | Admitting: Family Medicine

## 2021-06-07 VITALS — BP 128/72 | HR 66 | Temp 96.0°F | Ht 66.0 in | Wt 205.4 lb

## 2021-06-07 DIAGNOSIS — L723 Sebaceous cyst: Secondary | ICD-10-CM | POA: Diagnosis not present

## 2021-06-07 MED ORDER — SULFAMETHOXAZOLE-TRIMETHOPRIM 800-160 MG PO TABS: 1.0000 | ORAL_TABLET | Freq: Two times a day (BID) | ORAL | 0 refills | Status: DC

## 2021-06-07 NOTE — Progress Notes (Signed)
? ?Subjective:  ? ? Patient ID: Jonathan Reeves, male    DOB: March 31, 1960, 61 y.o.   MRN: [id number] ? ?Patient has a history of a sebaceous cyst that was infected on his right posterior shoulder in 2017.  He required incision and drainage.  Patient is unsure when he came back however it now is itching and burning.  On examination today there is a large approximately 3 cm indurated red sebaceous cyst that is firm and tense.  It appears to be inflamed. ?Past Medical History:  ?Diagnosis Date  ? Carpal tunnel syndrome on both sides   ? Elevated uric acid in blood   ? Prediabetes   ? Ulcerative colitis ([place])   ? Dr. Amedeo Plenty  ? ?No past surgical history on file. ?Current Outpatient Medications on File Prior to Visit  ?Medication Sig Dispense Refill  ? CALCIUM PO Take by mouth.    ? Multiple Vitamin (MULTIVITAMIN) tablet Take 1 tablet by mouth daily.    ? terbinafine (LAMISIL) 250 MG tablet Take 1 tablet (250 mg total) by mouth daily. 30 tablet 1  ? VITAMIN D PO Take by mouth.    ? zinc gluconate 50 MG tablet Take 50 mg by mouth daily.    ? ?No current facility-administered medications on file prior to visit.  ? ?No Known Allergies ?Social History  ? ?Socioeconomic History  ? Marital status: Married  ?  Spouse name: Not on file  ? Number of children: Not on file  ? Years of education: Not on file  ? Highest education level: Not on file  ?Occupational History  ? Not on file  ?Tobacco Use  ? Smoking status: Never  ? Smokeless tobacco: Never  ?Substance and Sexual Activity  ? Alcohol use: Yes  ?  Alcohol/week: 1.0 standard drink  ?  Types: 1 Cans of beer per week  ?  Comment: occasional  ? Drug use: Not on file  ? Sexual activity: Not on file  ?Other Topics Concern  ? Not on file  ?Social History Narrative  ? Not on file  ? ?Social Determinants of Health  ? ?Financial Resource Strain: Not on file  ?Food Insecurity: Not on file  ?Transportation Needs: Not on file  ?Physical Activity: Not on file  ?Stress: Not on file  ?Social  Connections: Not on file  ?Intimate Partner Violence: Not on file  ? ?Family History  ?Problem Relation Age of Onset  ? Arthritis Mother   ? Diabetes Father   ? Alcohol abuse Father   ? COPD Maternal Grandfather   ? Cancer Paternal Grandfather   ? Lupus Daughter   ? ? ? ? ?Review of Systems  ?Gastrointestinal:  Positive for abdominal pain.  ?All other systems reviewed and are negative. ? ?   ?Objective:  ? Physical Exam ?Vitals reviewed.  ?Constitutional:   ?   General: He is not in acute distress. ?   Appearance: He is well-developed. He is not diaphoretic.  ?HENT:  ?   Head: Normocephalic and atraumatic.  ?   Right Ear: External ear normal.  ?   Left Ear: External ear normal.  ?   Nose: Nose normal.  ?   Mouth/Throat:  ?   Pharynx: No oropharyngeal exudate.  ?Eyes:  ?   General: No scleral icterus.    ?   Right eye: No discharge.     ?   Left eye: No discharge.  ?   Conjunctiva/sclera: Conjunctivae normal.  ?   Pupils:  Pupils are equal, round, and reactive to light.  ?Neck:  ?   Thyroid: No thyromegaly.  ?   Vascular: No JVD.  ?   Trachea: No tracheal deviation.  ?Cardiovascular:  ?   Rate and Rhythm: Normal rate and regular rhythm.  ?   Heart sounds: Normal heart sounds. No murmur heard. ?  No friction rub. No gallop.  ?Pulmonary:  ?   Effort: Pulmonary effort is normal. No respiratory distress.  ?   Breath sounds: Normal breath sounds. No stridor. No wheezing or rales.  ?Chest:  ?   Chest wall: No tenderness.  ?Abdominal:  ?   General: Bowel sounds are normal. There is no distension.  ?   Palpations: Abdomen is soft. There is no mass.  ?   Tenderness: There is no abdominal tenderness. There is no guarding or rebound.  ?Musculoskeletal:     ?   General: No tenderness or deformity. Normal range of motion.  ?     Arms: ? ?   Cervical back: Normal range of motion and neck supple.  ?Lymphadenopathy:  ?   Cervical: No cervical adenopathy.  ?Skin: ?   General: Skin is warm.  ?   Findings: No erythema or rash.   ?Neurological:  ?   Mental Status: He is alert and oriented to person, place, and time.  ?   Cranial Nerves: No cranial nerve deficit.  ?   Motor: No abnormal muscle tone.  ?   Coordination: Coordination normal.  ?   Deep Tendon Reflexes: Reflexes are normal and symmetric.  ?Psychiatric:     ?   Behavior: Behavior normal.     ?   Thought Content: Thought content normal.     ?   Judgment: Judgment normal.  ? ? ? ? ? ?   ?Assessment & Plan:  ?Inflamed sebaceous cyst ?I anesthetized the skin with 0.1% lidocaine with epinephrine.  I made an vertical incision in the center of the inflamed sebaceous cyst down through the epidermis.  I found the cyst sac intact.  Therefore I performed an elliptical excision around the cyst sac 3.5 cm x 3.5 cm down to the underlying subcutaneous tissue.  However as I was trying to remove the cyst sac, it ruptured and drained yellow opaque foul-smelling material that was removed from the wound with vigorous pressure.  The wound was then irrigated after the cyst sac had been removed with saline and then sterilized with Betadine.  The skin edges were then approximated using 5 simple interrupted 3-0 Ethilon sutures.  The patient will be started on Bactrim double strength tablets twice daily for 7 days due to the potential risk of infection.  Recheck in 7 days to remove sutures or sooner if worsening ?

## 2021-09-13 DIAGNOSIS — H169 Unspecified keratitis: Secondary | ICD-10-CM | POA: Diagnosis not present

## 2022-01-30 ENCOUNTER — Encounter: Payer: Self-pay | Admitting: Family Medicine

## 2022-01-30 ENCOUNTER — Ambulatory Visit (INDEPENDENT_AMBULATORY_CARE_PROVIDER_SITE_OTHER): Payer: Federal, State, Local not specified - PPO | Admitting: Family Medicine

## 2022-01-30 VITALS — BP 126/72 | HR 59 | Ht 66.0 in | Wt 202.6 lb

## 2022-01-30 DIAGNOSIS — Z0000 Encounter for general adult medical examination without abnormal findings: Secondary | ICD-10-CM

## 2022-01-30 DIAGNOSIS — K5150 Left sided colitis without complications: Secondary | ICD-10-CM | POA: Insufficient documentation

## 2022-01-30 DIAGNOSIS — K5130 Ulcerative (chronic) rectosigmoiditis without complications: Secondary | ICD-10-CM | POA: Insufficient documentation

## 2022-01-30 DIAGNOSIS — M25562 Pain in left knee: Secondary | ICD-10-CM

## 2022-01-30 NOTE — Progress Notes (Signed)
Subjective:    Patient ID: Jonathan Reeves, male    DOB: 1960-08-01, 61 y.o.   MRN: [id number]  HPI Patient is a very pleasant 61 year old Caucasian male here today for complete physical exam.  Patient states that he had a colonoscopy about a year and a half ago.  Therefore this is up-to-date.  He is due for a PSA to screen for prostate cancer.  He regularly sees his gastroenterologist, Dr. Paulita Fujita due to his history of ulcerative colitis.  Otherwise he is doing well.  He denies any chest pain shortness of breath or dyspnea on exertion.  He denies any fevers or chills or abdominal pain or cough.  He does complain of some left-sided knee pain.  The pain is located over the medial and lateral joint lines.  It is worse with prolonged sitting and standing.  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  He denies any specific injuries. Past Medical History:  Diagnosis Date  . Carpal tunnel syndrome on both sides   . Elevated uric acid in blood   . Prediabetes   . Ulcerative colitis ([place])    Dr. Amedeo Plenty   No past surgical history on file. Current Outpatient Medications on File Prior to Visit  Medication Sig Dispense Refill  . CALCIUM PO Take by mouth.    . Multiple Vitamin (MULTIVITAMIN) tablet Take 1 tablet by mouth daily.    Marland Kitchen VITAMIN D PO Take by mouth.    . zinc gluconate 50 MG tablet Take 50 mg by mouth daily.     No current facility-administered medications on file prior to visit.   No Known Allergies Social History   Socioeconomic History  . Marital status: Married    Spouse name: Not on file  . Number of children: Not on file  . Years of education: Not on file  . Highest education level: Not on file  Occupational History  . Not on file  Tobacco Use  . Smoking status: Never  . Smokeless tobacco: Never  Substance and Sexual Activity  . Alcohol use: Yes    Alcohol/week: 1.0 standard drink of alcohol     Types: 1 Cans of beer per week    Comment: occasional  . Drug use: Not on file  . Sexual activity: Not on file  Other Topics Concern  . Not on file  Social History Narrative  . Not on file   Social Determinants of Health   Financial Resource Strain: Not on file  Food Insecurity: Not on file  Transportation Needs: Not on file  Physical Activity: Not on file  Stress: Not on file  Social Connections: Not on file  Intimate Partner Violence: Not on file   Family History  Problem Relation Age of Onset  . Arthritis Mother   . Diabetes Father   . Alcohol abuse Father   . COPD Maternal Grandfather   . Cancer Paternal Grandfather   . Lupus Daughter       Review of Systems  All other systems reviewed and are negative.      Objective:   Physical Exam Vitals reviewed.  Constitutional:      General: He is not in acute distress.    Appearance: Normal appearance. He is well-developed. He is not ill-appearing, toxic-appearing or diaphoretic.  HENT:     Head: Normocephalic and atraumatic.     Right Ear: Tympanic membrane and external ear normal. There is  no impacted cerumen.     Left Ear: Tympanic membrane and external ear normal. There is no impacted cerumen.     Nose: Nose normal. No congestion or rhinorrhea.     Mouth/Throat:     Mouth: Mucous membranes are moist.     Pharynx: Oropharynx is clear. No oropharyngeal exudate or posterior oropharyngeal erythema.  Eyes:     General: No scleral icterus.       Right eye: No discharge.        Left eye: No discharge.     Extraocular Movements: Extraocular movements intact.     Conjunctiva/sclera: Conjunctivae normal.     Pupils: Pupils are equal, round, and reactive to light.  Neck:     Thyroid: No thyromegaly.     Vascular: No carotid bruit or JVD.     Trachea: No tracheal deviation.  Cardiovascular:     Rate and Rhythm: Normal rate and regular rhythm.     Pulses: Normal pulses.     Heart sounds: Normal heart sounds. No  murmur heard.    No friction rub. No gallop.  Pulmonary:     Effort: Pulmonary effort is normal. No respiratory distress.     Breath sounds: Normal breath sounds. No stridor. No wheezing, rhonchi or rales.  Chest:     Chest wall: No tenderness.  Abdominal:     General: Bowel sounds are normal. There is no distension.     Palpations: Abdomen is soft. There is no mass.     Tenderness: There is no abdominal tenderness. There is no guarding or rebound.  Musculoskeletal:        General: No tenderness or deformity. Normal range of motion.     Cervical back: Normal range of motion and neck supple. No rigidity or tenderness.     Right lower leg: No edema.     Left lower leg: No edema.  Lymphadenopathy:     Cervical: No cervical adenopathy.  Skin:    General: Skin is warm.     Coloration: Skin is not jaundiced or pale.     Findings: No bruising, erythema, lesion or rash.  Neurological:     General: No focal deficit present.     Mental Status: He is alert and oriented to person, place, and time. Mental status is at baseline.     Cranial Nerves: No cranial nerve deficit.     Sensory: No sensory deficit.     Motor: No weakness or abnormal muscle tone.     Coordination: Coordination normal.     Gait: Gait normal.     Deep Tendon Reflexes: Reflexes are normal and symmetric. Reflexes normal.  Psychiatric:        Mood and Affect: Mood normal.        Behavior: Behavior normal.        Thought Content: Thought content normal.        Judgment: Judgment normal.          Assessment & Plan:  Acute pain of left knee - Plan: DG Knee Complete 4 Views Left  General medical exam - Plan: CBC with Differential/Platelet, Lipid panel, COMPLETE METABOLIC PANEL WITH GFR, PSA Patient is due for the shingles vaccine.  I encouraged him to get this.  Also recommended COVID shot.  I recommended against RSV vaccine as he is a low risk individual.  He is due for Prevnar 20 when he turns 63.  His colonoscopy is  up-to-date.  Screen for prostate cancer with PSA.  Check a CBC a CMP and a fasting lipid panel.  Colonoscopy is up-to-date.  I will obtain an x-ray of his left knee but I suspect that he likely has osteoarthritis.  We discussed using NSAIDs as needed for knee pain.  The present time he does not want any prescription medication.

## 2022-01-31 LAB — CBC WITH DIFFERENTIAL/PLATELET
Absolute Monocytes: 482 cells/uL (ref 200–950)
Basophils Absolute: 18 cells/uL (ref 0–200)
Basophils Relative: 0.3 %
Eosinophils Absolute: 49 cells/uL (ref 15–500)
Eosinophils Relative: 0.8 %
HCT: 45.4 % (ref 38.5–50.0)
Hemoglobin: 15.5 g/dL (ref 13.2–17.1)
Lymphs Abs: 2037 cells/uL (ref 850–3900)
MCH: 30.1 pg (ref 27.0–33.0)
MCHC: 34.1 g/dL (ref 32.0–36.0)
MCV: 88.2 fL (ref 80.0–100.0)
MPV: 11.7 fL (ref 7.5–12.5)
Monocytes Relative: 7.9 %
Neutro Abs: 3514 cells/uL (ref 1500–7800)
Neutrophils Relative %: 57.6 %
Platelets: 192 10*3/uL (ref 140–400)
RBC: 5.15 10*6/uL (ref 4.20–5.80)
RDW: 12.4 % (ref 11.0–15.0)
Total Lymphocyte: 33.4 %
WBC: 6.1 10*3/uL (ref 3.8–10.8)

## 2022-01-31 LAB — COMPLETE METABOLIC PANEL WITH GFR
AG Ratio: 2 (calc) (ref 1.0–2.5)
ALT: 21 U/L (ref 9–46)
AST: 20 U/L (ref 10–35)
Albumin: 4.7 g/dL (ref 3.6–5.1)
Alkaline phosphatase (APISO): 72 U/L (ref 35–144)
BUN: 20 mg/dL (ref 7–25)
CO2: 27 mmol/L (ref 20–32)
Calcium: 9.5 mg/dL (ref 8.6–10.3)
Chloride: 104 mmol/L (ref 98–110)
Creat: 0.97 mg/dL (ref 0.70–1.35)
Globulin: 2.3 g/dL (calc) (ref 1.9–3.7)
Glucose, Bld: 96 mg/dL (ref 65–99)
Potassium: 4.8 mmol/L (ref 3.5–5.3)
Sodium: 140 mmol/L (ref 135–146)
Total Bilirubin: 0.7 mg/dL (ref 0.2–1.2)
Total Protein: 7 g/dL (ref 6.1–8.1)
eGFR: 88 mL/min/{1.73_m2} (ref >=60)

## 2022-01-31 LAB — LIPID PANEL
Cholesterol: 172 mg/dL (ref <200)
HDL: 57 mg/dL (ref >=40)
LDL Cholesterol (Calc): 98 mg/dL (calc)
Non-HDL Cholesterol (Calc): 115 mg/dL (calc) (ref <130)
Total CHOL/HDL Ratio: 3 (calc) (ref <5.0)
Triglycerides: 84 mg/dL (ref <150)

## 2022-01-31 LAB — PSA: PSA: 0.63 ng/mL (ref <=4.00)

## 2023-02-06 ENCOUNTER — Encounter: Payer: Self-pay | Admitting: Family Medicine

## 2023-02-06 ENCOUNTER — Ambulatory Visit (INDEPENDENT_AMBULATORY_CARE_PROVIDER_SITE_OTHER): Payer: Federal, State, Local not specified - PPO | Admitting: Family Medicine

## 2023-02-06 VITALS — BP 126/76 | HR 50 | Temp 97.8°F | Ht 66.0 in | Wt 208.6 lb

## 2023-02-06 DIAGNOSIS — Z0000 Encounter for general adult medical examination without abnormal findings: Secondary | ICD-10-CM | POA: Diagnosis not present

## 2023-02-06 DIAGNOSIS — Z125 Encounter for screening for malignant neoplasm of prostate: Secondary | ICD-10-CM

## 2023-02-06 NOTE — Progress Notes (Signed)
Subjective:    Patient ID: Jonathan Reeves, male    DOB: 05/25/1960, 62 y.o.   MRN: [id number]  HPI Patient is a very pleasant 62 year old Caucasian male here today for complete physical exam.  Patient states that he had a colonoscopy 2 years ago with the [facility].  He states that they contact him and schedule this for him but he is due next year.  He is monitored closely due to his history of ulcerative colitis.  He is due for a PSA.  Otherwise the patient is doing well.  He politely declines a flu shot as well as a COVID-vaccine.  He is due for the shingles shot.  He defers that today but states he would like to return to get this at a later date.  He denies any chest pain or shortness of breath. Past Medical History:  Diagnosis Date   Carpal tunnel syndrome on both sides    Elevated uric acid in blood    Prediabetes    Ulcerative colitis ([facility])    Dr. Madilyn Fireman   No past surgical history on file. Current Outpatient Medications on File Prior to Visit  Medication Sig Dispense Refill   CALCIUM PO Take by mouth.     Multiple Vitamin (MULTIVITAMIN) tablet Take 1 tablet by mouth daily.     VITAMIN D PO Take by mouth.     zinc gluconate 50 MG tablet Take 50 mg by mouth daily.     No current facility-administered medications on file prior to visit.   No Known Allergies Social History   Socioeconomic History   Marital status: Married    Spouse name: Not on file   Number of children: Not on file   Years of education: Not on file   Highest education level: Not on file  Occupational History   Not on file  Tobacco Use   Smoking status: Never   Smokeless tobacco: Never  Substance and Sexual Activity   Alcohol use: Yes    Alcohol/week: 1.0 standard drink of alcohol    Types: 1 Cans of beer per week    Comment: occasional   Drug use: Not on file   Sexual activity: Not on file  Other Topics Concern   Not on file  Social History Narrative   Not on file   Social Determinants of Health    Financial Resource Strain: Not on file  Food Insecurity: Not on file  Transportation Needs: Not on file  Physical Activity: Not on file  Stress: Not on file  Social Connections: Not on file  Intimate Partner Violence: Not on file   Family History  Problem Relation Age of Onset   Arthritis Mother    Diabetes Father    Alcohol abuse Father    COPD Maternal Grandfather    Cancer Paternal Grandfather    Lupus Daughter       Review of Systems  All other systems reviewed and are negative.      Objective:   Physical Exam Vitals reviewed.  Constitutional:      General: He is not in acute distress.    Appearance: He is well-developed. He is not diaphoretic.  HENT:     Head: Normocephalic and atraumatic.     Right Ear: External ear normal. There is impacted cerumen.     Left Ear: External ear normal. There is impacted cerumen.     Nose: Nose normal. No congestion.     Mouth/Throat:     Mouth: Mucous  membranes are moist.     Pharynx: Oropharynx is clear. No oropharyngeal exudate or posterior oropharyngeal erythema.  Eyes:     General: No scleral icterus.       Right eye: No discharge.        Left eye: No discharge.     Extraocular Movements: Extraocular movements intact.     Conjunctiva/sclera: Conjunctivae normal.     Pupils: Pupils are equal, round, and reactive to light.  Neck:     Thyroid: No thyromegaly.     Vascular: No JVD.     Trachea: No tracheal deviation.  Cardiovascular:     Rate and Rhythm: Normal rate and regular rhythm.     Heart sounds: Normal heart sounds. No murmur heard.    No friction rub. No gallop.  Pulmonary:     Effort: Pulmonary effort is normal. No respiratory distress.     Breath sounds: Normal breath sounds. No stridor. No wheezing, rhonchi or rales.  Chest:     Chest wall: No tenderness.  Abdominal:     General: Bowel sounds are normal. There is no distension.     Palpations: Abdomen is soft. There is no mass.     Tenderness: There  is no abdominal tenderness. There is no guarding or rebound.  Genitourinary:    Penis: Normal.      Testes: Normal.  Musculoskeletal:        General: No tenderness or deformity. Normal range of motion.     Cervical back: Normal range of motion and neck supple.  Lymphadenopathy:     Cervical: No cervical adenopathy.  Skin:    General: Skin is warm.     Coloration: Skin is not pale.     Findings: No erythema or rash.  Neurological:     General: No focal deficit present.     Mental Status: He is alert and oriented to person, place, and time.     Cranial Nerves: No cranial nerve deficit.     Sensory: No sensory deficit.     Motor: No weakness or abnormal muscle tone.     Coordination: Coordination normal.     Gait: Gait normal.     Deep Tendon Reflexes: Reflexes are normal and symmetric. Reflexes normal.  Psychiatric:        Mood and Affect: Mood normal.        Behavior: Behavior normal.        Thought Content: Thought content normal.        Judgment: Judgment normal.           Assessment & Plan:  General medical exam - Plan: CT CARDIAC SCORING (SELF PAY ONLY), CBC with Differential/Platelet, COMPLETE METABOLIC PANEL WITH GFR, Lipid panel  Prostate cancer screening - Plan: PSA Patient's blood pressure is outstanding.  His cholesterol has always been well-controlled.  However given his age along with his employment (the patient works as a Company secretary), I would like to get a coronary artery calcium score to better risk stratify the patient to determine if there is any evidence of coronary artery disease which would change the way we are managing his cholesterol.  The patient is in agreement.  I will also check a CBC, CMP, and a lipid panel.  Screen for prostate cancer with PSA.  Recommended the shingles vaccine along with a flu shot and a COVID shot but he defers these at the present time.

## 2023-02-07 LAB — CBC WITH DIFFERENTIAL/PLATELET
Absolute Lymphocytes: 2145 {cells}/uL (ref 850–3900)
Absolute Monocytes: 502 {cells}/uL (ref 200–950)
Basophils Absolute: 31 {cells}/uL (ref 0–200)
Basophils Relative: 0.5 %
Eosinophils Absolute: 37 {cells}/uL (ref 15–500)
Eosinophils Relative: 0.6 %
HCT: 44.3 % (ref 38.5–50.0)
Hemoglobin: 15.3 g/dL (ref 13.2–17.1)
MCH: 30.1 pg (ref 27.0–33.0)
MCHC: 34.5 g/dL (ref 32.0–36.0)
MCV: 87 fL (ref 80.0–100.0)
MPV: 10.6 fL (ref 7.5–12.5)
Monocytes Relative: 8.1 %
Neutro Abs: 3484 {cells}/uL (ref 1500–7800)
Neutrophils Relative %: 56.2 %
Platelets: 220 10*3/uL (ref 140–400)
RBC: 5.09 10*6/uL (ref 4.20–5.80)
RDW: 12 % (ref 11.0–15.0)
Total Lymphocyte: 34.6 %
WBC: 6.2 10*3/uL (ref 3.8–10.8)

## 2023-02-07 LAB — LIPID PANEL
Cholesterol: 201 mg/dL — ABNORMAL HIGH (ref <200)
HDL: 57 mg/dL (ref >=40)
LDL Cholesterol (Calc): 128 mg/dL — ABNORMAL HIGH
Non-HDL Cholesterol (Calc): 144 mg/dL — ABNORMAL HIGH (ref <130)
Total CHOL/HDL Ratio: 3.5 (calc) (ref <5.0)
Triglycerides: 72 mg/dL (ref <150)

## 2023-02-07 LAB — COMPLETE METABOLIC PANEL WITH GFR
AG Ratio: 2.1 (calc) (ref 1.0–2.5)
ALT: 19 U/L (ref 9–46)
AST: 19 U/L (ref 10–35)
Albumin: 4.6 g/dL (ref 3.6–5.1)
Alkaline phosphatase (APISO): 89 U/L (ref 35–144)
BUN: 18 mg/dL (ref 7–25)
CO2: 29 mmol/L (ref 20–32)
Calcium: 9.6 mg/dL (ref 8.6–10.3)
Chloride: 102 mmol/L (ref 98–110)
Creat: 1 mg/dL (ref 0.70–1.35)
Globulin: 2.2 g/dL (ref 1.9–3.7)
Glucose, Bld: 98 mg/dL (ref 65–99)
Potassium: 4.5 mmol/L (ref 3.5–5.3)
Sodium: 140 mmol/L (ref 135–146)
Total Bilirubin: 0.7 mg/dL (ref 0.2–1.2)
Total Protein: 6.8 g/dL (ref 6.1–8.1)
eGFR: 84 mL/min/{1.73_m2} (ref >=60)

## 2023-02-07 LAB — PSA: PSA: 0.77 ng/mL (ref <=4.00)

## 2023-02-20 ENCOUNTER — Ambulatory Visit (HOSPITAL_COMMUNITY)
Admission: RE | Admit: 2023-02-20 | Discharge: 2023-02-20 | Disposition: A | Discharge status: Home / Self Care | Payer: Self-pay | Source: Ambulatory Visit | Attending: Family Medicine | Admitting: Family Medicine

## 2023-02-20 DIAGNOSIS — Z0000 Encounter for general adult medical examination without abnormal findings: Secondary | ICD-10-CM | POA: Insufficient documentation

## 2023-09-03 ENCOUNTER — Other Ambulatory Visit (INDEPENDENT_AMBULATORY_CARE_PROVIDER_SITE_OTHER): Payer: Self-pay

## 2023-09-03 ENCOUNTER — Ambulatory Visit: Admitting: Surgical

## 2023-09-03 DIAGNOSIS — M25462 Effusion, left knee: Secondary | ICD-10-CM

## 2023-09-03 DIAGNOSIS — G8929 Other chronic pain: Secondary | ICD-10-CM | POA: Diagnosis not present

## 2023-09-03 DIAGNOSIS — M25562 Pain in left knee: Secondary | ICD-10-CM

## 2023-09-06 ENCOUNTER — Encounter: Payer: Self-pay | Admitting: Surgical

## 2023-09-06 NOTE — Progress Notes (Signed)
 Office Visit Note   Patient: Jonathan Reeves           Date of Birth: Nov 30, 1960           MRN: [id number] Visit Date: 09/03/2023 Requested by: Duanne Butler DASEN, MD [address] [place],  [place] [address] PCP: Duanne Butler DASEN, MD  Subjective: Chief Complaint  Patient presents with   Left Knee - Pain    HPI: Jonathan Reeves is a 63 y.o. male who presents to the office reporting left knee pain.  Patient states that over the last 3 months he has had atraumatic onset of medial sided knee pain.  He is very active.  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  In his free time he enjoys taking self-defense classes.  He describes medial sided left knee pain with occasional radiation down the shin.  No numbness or tingling or groin pain.  He denies any popping sensation.  Pain is worse with flexion and has occasional swelling.  No history of prior knee surgery.  He uses ibuprofen very rarely.  Overall does not take any significant medications aside from the occasional ibuprofen.  No history of issue with dyspnea in the past.  Occasionally will use hinged knee brace..                ROS: All systems reviewed are negative as they relate to the chief complaint within the history of present illness.  Patient denies fevers or chills.  Assessment & Plan: Visit Diagnoses:  1. Chronic pain of left knee     Plan: Patient is a 63 year old male who presents for evaluation of left knee pain.  Has history of left knee pain that has been ongoing for about 3 months without history of injury.  He does have effusion in the left knee that is not present in the right as well as medial joint line tenderness and loss of passive extension.  With all these signs as well as normal radiographs, concern for meniscal injury.  Positive McMurray's sign on exam today.  Plan to order MRI of the left knee for further evaluation of medial meniscal tear and follow-up after MRI  to review results.  Follow-Up Instructions: No follow-ups on file.   Orders:  Orders Placed This Encounter  Procedures   XR KNEE 3 VIEW LEFT   MR Knee Left w/o contrast   No orders of the defined types were placed in this encounter.     Procedures: No procedures performed   Clinical Data: No additional findings.  Objective: Vital Signs: There were no vitals taken for this visit.  Physical Exam:  Constitutional: Patient appears well-developed HEENT:  Head: Normocephalic Eyes:EOM are normal Neck: Normal range of motion Cardiovascular: Normal rate Pulmonary/chest: Effort normal Neurologic: Patient is alert Skin: Skin is warm Psychiatric: Patient has normal mood and affect  Ortho Exam: Ortho exam demonstrates left knee with 3 degrees extension with soft endpoint versus 0 degrees extension in the right knee.  About 120 degrees of knee flexion bilaterally.  Does have small effusion in the left knee with no effusion in the right knee.  Medial joint line tenderness especially posterior medially in the left knee.  Positive McMurray's sign reproducing medial joint pain.  No pain with hip range of motion.  Negative anterior posterior drawer sign.  No laxity to varus or valgus stress at 0 and 30 degrees.  Able to perform straight  leg raise with excellent quad strength rated 5/5.  No calf tenderness.  Negative Homans' sign.  Palpable PT pulse.  Specialty Comments:  No specialty comments available.  Imaging: No results found.   PMFS History: Patient Active Problem List   Diagnosis Date Noted   Left sided ulcerative colitis ([facility]) 01/30/2022   Chronic ulcerative rectosigmoiditis ([facility]) 01/30/2022   Prediabetes    Elevated uric acid in blood    Past Medical History:  Diagnosis Date   Carpal tunnel syndrome on both sides    Elevated uric acid in blood    Prediabetes    Ulcerative colitis ([facility])    Dr. Dyane    Family History  Problem Relation Age of Onset   Arthritis Mother     Diabetes Father    Alcohol abuse Father    COPD Maternal Grandfather    Cancer Paternal Grandfather    Lupus Daughter     No past surgical history on file. Social History   Occupational History   Not on file  Tobacco Use   Smoking status: Never   Smokeless tobacco: Never  Substance and Sexual Activity   Alcohol use: Yes    Alcohol/week: 1.0 standard drink of alcohol    Types: 1 Cans of beer per week    Comment: occasional   Drug use: Not on file   Sexual activity: Not on file

## 2023-09-14 ENCOUNTER — Ambulatory Visit (HOSPITAL_COMMUNITY)
Admission: RE | Admit: 2023-09-14 | Discharge: 2023-09-14 | Disposition: A | Discharge status: Home / Self Care | Source: Ambulatory Visit | Attending: Surgical | Admitting: Surgical

## 2023-09-14 DIAGNOSIS — M25462 Effusion, left knee: Secondary | ICD-10-CM | POA: Diagnosis not present

## 2023-09-14 DIAGNOSIS — S83232A Complex tear of medial meniscus, current injury, left knee, initial encounter: Secondary | ICD-10-CM | POA: Diagnosis not present

## 2023-09-14 DIAGNOSIS — M25562 Pain in left knee: Secondary | ICD-10-CM | POA: Insufficient documentation

## 2023-09-14 DIAGNOSIS — M2242 Chondromalacia patellae, left knee: Secondary | ICD-10-CM | POA: Diagnosis not present

## 2023-09-14 DIAGNOSIS — M1712 Unilateral primary osteoarthritis, left knee: Secondary | ICD-10-CM | POA: Diagnosis not present

## 2023-09-14 DIAGNOSIS — G8929 Other chronic pain: Secondary | ICD-10-CM | POA: Insufficient documentation

## 2023-10-05 ENCOUNTER — Encounter: Payer: Self-pay | Admitting: Surgical

## 2023-10-05 ENCOUNTER — Ambulatory Visit (INDEPENDENT_AMBULATORY_CARE_PROVIDER_SITE_OTHER): Admitting: Surgical

## 2023-10-05 DIAGNOSIS — D123 Benign neoplasm of transverse colon: Secondary | ICD-10-CM | POA: Diagnosis not present

## 2023-10-05 DIAGNOSIS — S83207A Unspecified tear of unspecified meniscus, current injury, left knee, initial encounter: Secondary | ICD-10-CM

## 2023-10-05 DIAGNOSIS — Z09 Encounter for follow-up examination after completed treatment for conditions other than malignant neoplasm: Secondary | ICD-10-CM | POA: Diagnosis not present

## 2023-10-05 DIAGNOSIS — K5150 Left sided colitis without complications: Secondary | ICD-10-CM | POA: Diagnosis not present

## 2023-10-05 DIAGNOSIS — Z860101 Personal history of adenomatous and serrated colon polyps: Secondary | ICD-10-CM | POA: Diagnosis not present

## 2023-10-05 DIAGNOSIS — M25462 Effusion, left knee: Secondary | ICD-10-CM | POA: Diagnosis not present

## 2023-10-05 NOTE — Progress Notes (Signed)
 Office Visit Note   Patient: Jonathan Reeves           Date of Birth: August 10, 1960           MRN: [id number] Visit Date: 10/05/2023 Requested by: Duanne Butler DASEN, MD [address] [place],  [place] [address] PCP: Duanne Butler DASEN, MD  Subjective: Chief Complaint  Patient presents with   Left Knee - Pain    MRI review    HPI: Jonathan Reeves is a 63 y.o. male who presents to the office for MRI review. Patient denies any changes in symptoms.  Continues to complain mainly of medial knee pain with some anterior knee pain as well.  Describes the pain as a soreness.  Takes occasional ibuprofen or Tylenol but this is rare for him.  Pain is worse with squatting and with going downstairs.  He denies any mechanical symptoms.  No locking symptoms.  MRI results revealed: MR Knee Left w/o contrast Result Date: 09/15/2023 MR KNEE WITHOUT IV CONTRAST COMPARISON: X-ray 09/03/2023 CLINICAL HISTORY: Meniscal injury, evaluate for medial meniscal tear. Left knee pain. PULSE SEQUENCES: Ax PD FS, Sag T2 ACL, Sag PD FS, Cor PD FS & COR T1 FINDINGS: Bones: There is no fracture or contusion pattern. Moderate chondromalacia seen in the central patella with. Near full-thickness cartilage loss. Mild subchondral reactive edema is identified. There is a small reactive joint effusion. There is a slightly prominent medial plica without significant thickening. The extensor mechanism is intact. Ligaments: The ACL, PCL, MCL and fibular collateral ligament are intact. There is slight thickening of the MCL suggesting a chronic sprain. Menisci: Lateral meniscus is unremarkable. There is a tear of the posterior horn and posterior body of the medial meniscus without evidence of a significant displaced meniscal flap. IMPRESSION: Complex tear of the posterior horn and body of the medial meniscus. Mild thickening of the MCL suggesting a sprain. Small reactive joint effusion. Degenerative changes of patella. Electronically signed by:  Norleen Satchel MD 09/15/2023 12:04 PM EDT RP Workstation: [id number]                 ROS: All systems reviewed are negative as they relate to the chief complaint within the history of present illness.  Patient denies fevers or chills.  Assessment & Plan: Visit Diagnoses:  1. Effusion, left knee   2. Acute meniscal tear of left knee, initial encounter     Plan: Jonathan Reeves is a 63 y.o. male who presents to the office for review of left knee MRI scan.  MRI does demonstrate complex tear of the posterior medial meniscus with some mild to moderate degenerative changes of the patellofemoral compartment.SABRA  He is not having any mechanical symptoms.  We did discuss options available to patient such as waiting this out to see if it gets less symptomatic versus trying cortisone injection versus PRP injection versus surgical intervention with arthroscopy and meniscal debridement.  Patient would like to avoid surgery at all costs.  We did discuss the potential outcomes from surgical versus nonsurgical treatment such as increased chondral wear from meniscal flap and increased chondral wear from decreased meniscal volume after meniscal debridement.  After lengthy discussion, patient would like to try physical therapy for his left knee at [facility] physical therapy.  He has had good experience with Garment/textile technologist and wants to give this ago.  If this does not help, plan to return in 6 weeks for clinical recheck and consideration of PRP injection.  If all this  fails to help, Court will consider knee arthroscopy with meniscal debridement.  Follow-Up Instructions: No follow-ups on file.   Orders:  No orders of the defined types were placed in this encounter.  No orders of the defined types were placed in this encounter.     Procedures: No procedures performed   Clinical Data: No additional findings.  Objective: Vital Signs: There were no vitals taken for this visit.  Physical Exam:  Constitutional:  Patient appears well-developed HEENT:  Head: Normocephalic Eyes:EOM are normal Neck: Normal range of motion Cardiovascular: Normal rate Pulmonary/chest: Effort normal Neurologic: Patient is alert Skin: Skin is warm Psychiatric: Patient has normal mood and affect  Ortho Exam: Ortho exam demonstrates left knee with 0 degrees extension which is improved compared to last visit.  He has moderate tenderness primarily over the posterior medial joint line.  No tenderness elsewhere in the knee significantly.  Able to perform straight leg raise without extensor lag.  Excellent quad strength and hamstring strength rated 5/5.  Palpable PT pulse.  No calf tenderness.  Negative Homans' sign. Flexion of the left knee has about 115 degrees.  He has no pain with hip range of motion.  Stable to varus and valgus stress at 0 and 30 degrees though valgus stress does reproduce some medial sided pain.  Mild to moderate effusion present.  Specialty Comments:  No specialty comments available.  Imaging: No results found.   PMFS History: Patient Active Problem List   Diagnosis Date Noted   Left sided ulcerative colitis (HCC) 01/30/2022   Chronic ulcerative rectosigmoiditis (HCC) 01/30/2022   Prediabetes    Elevated uric acid in blood    Past Medical History:  Diagnosis Date   Carpal tunnel syndrome on both sides    Elevated uric acid in blood    Prediabetes    Ulcerative colitis (HCC)    Dr. Dyane    Family History  Problem Relation Age of Onset   Arthritis Mother    Diabetes Father    Alcohol abuse Father    COPD Maternal Grandfather    Cancer Paternal Grandfather    Lupus Daughter     No past surgical history on file. Social History   Occupational History   Not on file  Tobacco Use   Smoking status: Never   Smokeless tobacco: Never  Substance and Sexual Activity   Alcohol use: Yes    Alcohol/week: 1.0 standard drink of alcohol    Types: 1 Cans of beer per week    Comment: occasional    Drug use: Not on file   Sexual activity: Not on file

## 2023-10-07 ENCOUNTER — Encounter: Payer: Self-pay | Admitting: Surgical

## 2023-10-22 DIAGNOSIS — M25562 Pain in left knee: Secondary | ICD-10-CM | POA: Diagnosis not present

## 2023-10-25 DIAGNOSIS — M25562 Pain in left knee: Secondary | ICD-10-CM | POA: Diagnosis not present

## 2023-10-29 DIAGNOSIS — M25562 Pain in left knee: Secondary | ICD-10-CM | POA: Diagnosis not present

## 2023-10-31 DIAGNOSIS — M25562 Pain in left knee: Secondary | ICD-10-CM | POA: Diagnosis not present

## 2023-11-06 DIAGNOSIS — M25562 Pain in left knee: Secondary | ICD-10-CM | POA: Diagnosis not present

## 2023-11-08 DIAGNOSIS — M25562 Pain in left knee: Secondary | ICD-10-CM | POA: Diagnosis not present

## 2023-11-12 DIAGNOSIS — M25562 Pain in left knee: Secondary | ICD-10-CM | POA: Diagnosis not present

## 2023-11-16 ENCOUNTER — Ambulatory Visit: Admitting: Surgical

## 2023-11-21 DIAGNOSIS — M25562 Pain in left knee: Secondary | ICD-10-CM | POA: Diagnosis not present

## 2024-01-07 ENCOUNTER — Encounter: Payer: Self-pay | Admitting: Radiology

## 2024-02-12 ENCOUNTER — Encounter: Payer: Federal, State, Local not specified - PPO | Admitting: Family Medicine

## 2024-04-01 ENCOUNTER — Encounter: Admitting: Family Medicine

## 2024-04-01 ENCOUNTER — Encounter: Payer: Self-pay | Admitting: Family Medicine

## 2024-04-01 ENCOUNTER — Ambulatory Visit: Admitting: Family Medicine

## 2024-04-01 VITALS — BP 134/72 | HR 59 | Temp 97.9°F | Ht 66.0 in | Wt 206.0 lb

## 2024-04-01 DIAGNOSIS — M7742 Metatarsalgia, left foot: Secondary | ICD-10-CM

## 2024-04-01 DIAGNOSIS — Z0000 Encounter for general adult medical examination without abnormal findings: Secondary | ICD-10-CM

## 2024-04-01 DIAGNOSIS — Z125 Encounter for screening for malignant neoplasm of prostate: Secondary | ICD-10-CM | POA: Diagnosis not present

## 2024-04-01 DIAGNOSIS — M7741 Metatarsalgia, right foot: Secondary | ICD-10-CM | POA: Diagnosis not present

## 2024-04-01 DIAGNOSIS — Z0001 Encounter for general adult medical examination with abnormal findings: Secondary | ICD-10-CM | POA: Diagnosis not present

## 2024-04-01 DIAGNOSIS — E7800 Pure hypercholesterolemia, unspecified: Secondary | ICD-10-CM | POA: Diagnosis not present

## 2024-04-01 NOTE — Progress Notes (Signed)
 "  Subjective:    Patient ID: Jonathan Reeves, male    DOB: 10-21-1960, 64 y.o.   MRN: [id number]  HPI Patient is a very pleasant 64 year old Caucasian male here today for complete physical exam.  Patient had a colonoscopy in 2025 under the care of Dr. Burnette.  This was normal. had coronary artery calcium score last year that was zero!  As a result, he has elected against statin therapy for mild hyperlipidemia.  Patient reports numbness in his right leg that radiates from his gluteus to his toes when he has been driving for a long period of time.  I believe that this is compression of the sciatic nerve against the ischial spine.  He denies any back pain or pain with standing.  He also complains of metatarsalgia left foot greater than right foot.  He has tenderness to palpation over the MTP joints of the 2nd and 3rd toes especially on the left foot.  It appears that he has a collapsed transverse arch. Immunization History  Administered Date(s) Administered   Tdap 07/07/2014, 09/25/2019    Past Medical History:  Diagnosis Date   Carpal tunnel syndrome on both sides    Elevated uric acid in blood    Prediabetes    Ulcerative colitis (HCC)    Dr. Dyane   No past surgical history on file. Current Outpatient Medications on File Prior to Visit  Medication Sig Dispense Refill   CALCIUM PO Take by mouth.     Multiple Vitamin (MULTIVITAMIN) tablet Take 1 tablet by mouth daily.     VITAMIN D PO Take by mouth.     zinc  gluconate 50 MG tablet Take 50 mg by mouth daily.     No current facility-administered medications on file prior to visit.   No Known Allergies Social History   Socioeconomic History   Marital status: Married    Spouse name: Not on file   Number of children: Not on file   Years of education: Not on file   Highest education level: Associate degree: occupational, scientist, product/process development, or vocational program  Occupational History   Not on file  Tobacco Use   Smoking status: Never    Smokeless tobacco: Never  Substance and Sexual Activity   Alcohol use: Yes    Alcohol/week: 1.0 standard drink of alcohol    Types: 1 Cans of beer per week    Comment: occasional   Drug use: Not on file   Sexual activity: Not on file  Other Topics Concern   Not on file  Social History Narrative   Not on file   Social Drivers of Health   Tobacco Use: Low Risk (04/01/2024)   Patient History    Smoking Tobacco Use: Never    Smokeless Tobacco Use: Never    Passive Exposure: Not on file  Financial Resource Strain: Patient Declined (03/31/2024)   Overall Financial Resource Strain (CARDIA)    Difficulty of Paying Living Expenses: Patient declined  Food Insecurity: Patient Declined (03/31/2024)   Epic    Worried About Programme Researcher, Broadcasting/film/video in the Last Year: Patient declined    Barista in the Last Year: Patient declined  Transportation Needs: No Transportation Needs (03/31/2024)   Epic    Lack of Transportation (Medical): No    Lack of Transportation (Non-Medical): No  Physical Activity: Sufficiently Active (03/31/2024)   Exercise Vital Sign    Days of Exercise per Week: 7 days    Minutes of Exercise per Session: 60  min  Stress: No Stress Concern Present (03/31/2024)   [employer] of Occupational Health - Occupational Stress Questionnaire    Feeling of Stress: Not at all  Social Connections: Unknown (03/31/2024)   Social Connection and Isolation Panel    Frequency of Communication with Friends and Family: Never    Frequency of Social Gatherings with Friends and Family: Patient declined    Attends Religious Services: More than 4 times per year    Active Member of Clubs or Organizations: Yes    Attends Engineer, Structural: More than 4 times per year    Marital Status: Married  Catering Manager Violence: Not on file  Depression (PHQ2-9): Low Risk (02/06/2023)   Depression (PHQ2-9)    PHQ-2 Score: 0  Alcohol Screen: Low Risk (03/31/2024)   Alcohol Screen    Last  Alcohol Screening Score (AUDIT): 2  Housing: Unknown (03/31/2024)   Epic    Unable to Pay for Housing in the Last Year: Patient declined    Number of Times Moved in the Last Year: 0    Homeless in the Last Year: No  Utilities: Not on file  Health Literacy: Not on file   Family History  Problem Relation Age of Onset   Arthritis Mother    Diabetes Father    Alcohol abuse Father    COPD Maternal Grandfather    Cancer Paternal Grandfather    Lupus Daughter       Review of Systems  All other systems reviewed and are negative.      Objective:   Physical Exam Vitals reviewed.  Constitutional:      General: He is not in acute distress.    Appearance: He is well-developed. He is not diaphoretic.  HENT:     Head: Normocephalic and atraumatic.     Right Ear: External ear normal. There is impacted cerumen.     Left Ear: External ear normal. There is impacted cerumen.     Nose: Nose normal. No congestion.     Mouth/Throat:     Mouth: Mucous membranes are moist.     Pharynx: Oropharynx is clear. No oropharyngeal exudate or posterior oropharyngeal erythema.  Eyes:     General: No scleral icterus.       Right eye: No discharge.        Left eye: No discharge.     Extraocular Movements: Extraocular movements intact.     Conjunctiva/sclera: Conjunctivae normal.     Pupils: Pupils are equal, round, and reactive to light.  Neck:     Thyroid: No thyromegaly.     Vascular: No JVD.     Trachea: No tracheal deviation.  Cardiovascular:     Rate and Rhythm: Normal rate and regular rhythm.     Heart sounds: Normal heart sounds. No murmur heard.    No friction rub. No gallop.  Pulmonary:     Effort: Pulmonary effort is normal. No respiratory distress.     Breath sounds: Normal breath sounds. No stridor. No wheezing, rhonchi or rales.  Chest:     Chest wall: No tenderness.  Abdominal:     General: Bowel sounds are normal. There is no distension.     Palpations: Abdomen is soft. There  is no mass.     Tenderness: There is no abdominal tenderness. There is no guarding or rebound.  Genitourinary:    Penis: Normal.      Testes: Normal.  Musculoskeletal:        General: No tenderness  or deformity. Normal range of motion.     Cervical back: Normal range of motion and neck supple.  Lymphadenopathy:     Cervical: No cervical adenopathy.  Skin:    General: Skin is warm.     Coloration: Skin is not pale.     Findings: No erythema or rash.  Neurological:     General: No focal deficit present.     Mental Status: He is alert and oriented to person, place, and time.     Cranial Nerves: No cranial nerve deficit.     Sensory: No sensory deficit.     Motor: No weakness or abnormal muscle tone.     Coordination: Coordination normal.     Gait: Gait normal.     Deep Tendon Reflexes: Reflexes are normal and symmetric. Reflexes normal.  Psychiatric:        Mood and Affect: Mood normal.        Behavior: Behavior normal.        Thought Content: Thought content normal.        Judgment: Judgment normal.           Assessment & Plan:  General medical exam - Plan: CBC with Differential/Platelet, Comprehensive metabolic panel with GFR, Lipid panel, PSA  Prostate cancer screening - Plan: PSA  Pure hypercholesterolemia - Plan: CBC with Differential/Platelet, Comprehensive metabolic panel with GFR, Lipid panel  Metatarsalgia of both feet - Plan: Ambulatory referral to Podiatry We will check fasting lab work today including a CBC a CMP lipid panel and a PSA.  Blood pressure is acceptable.  Patient declines a flu shot.  Recommended Prevnar 20 and a shingles vaccine.  He declined these today but states he will get them at a later date.  Colonoscopy is up-to-date.  Screening for prostate cancer with a PSA.  Recommended stretches for piriformis syndrome to help with the nerve compression in his right leg with sitting.  I believe his pain in his foot is due to metatarsalgia from a collapsed  transverse arch.  Recommended podiatry consultation for possible orthotics. "

## 2024-04-02 LAB — COMPREHENSIVE METABOLIC PANEL WITH GFR
AG Ratio: 2.5 (calc) (ref 1.0–2.5)
ALT: 25 U/L (ref 9–46)
AST: 23 U/L (ref 10–35)
Albumin: 4.9 g/dL (ref 3.6–5.1)
Alkaline phosphatase (APISO): 67 U/L (ref 35–144)
BUN: 19 mg/dL (ref 7–25)
CO2: 30 mmol/L (ref 20–32)
Calcium: 9.5 mg/dL (ref 8.6–10.3)
Chloride: 105 mmol/L (ref 98–110)
Creat: 1.01 mg/dL (ref 0.70–1.35)
Globulin: 2 g/dL (ref 1.9–3.7)
Glucose, Bld: 96 mg/dL (ref 65–99)
Potassium: 4.5 mmol/L (ref 3.5–5.3)
Sodium: 141 mmol/L (ref 135–146)
Total Bilirubin: 0.8 mg/dL (ref 0.2–1.2)
Total Protein: 6.9 g/dL (ref 6.1–8.1)
eGFR: 83 mL/min/{1.73_m2}

## 2024-04-02 LAB — LIPID PANEL
Cholesterol: 176 mg/dL
HDL: 58 mg/dL
LDL Cholesterol (Calc): 102 mg/dL — ABNORMAL HIGH
Non-HDL Cholesterol (Calc): 118 mg/dL
Total CHOL/HDL Ratio: 3 (calc)
Triglycerides: 71 mg/dL

## 2024-04-02 LAB — CBC WITH DIFFERENTIAL/PLATELET
Absolute Lymphocytes: 2144 {cells}/uL (ref 850–3900)
Absolute Monocytes: 570 {cells}/uL (ref 200–950)
Basophils Absolute: 32 {cells}/uL (ref 0–200)
Basophils Relative: 0.5 %
Eosinophils Absolute: 58 {cells}/uL (ref 15–500)
Eosinophils Relative: 0.9 %
HCT: 44.6 % (ref 39.4–51.1)
Hemoglobin: 15.2 g/dL (ref 13.2–17.1)
MCH: 30 pg (ref 27.0–33.0)
MCHC: 34.1 g/dL (ref 31.6–35.4)
MCV: 88 fL (ref 81.4–101.7)
MPV: 11.4 fL (ref 7.5–12.5)
Monocytes Relative: 8.9 %
Neutro Abs: 3597 {cells}/uL (ref 1500–7800)
Neutrophils Relative %: 56.2 %
Platelets: 217 10*3/uL (ref 140–400)
RBC: 5.07 Million/uL (ref 4.20–5.80)
RDW: 12.4 % (ref 11.0–15.0)
Total Lymphocyte: 33.5 %
WBC: 6.4 10*3/uL (ref 3.8–10.8)

## 2024-04-02 LAB — PSA: PSA: 0.61 ng/mL

## 2024-04-03 ENCOUNTER — Ambulatory Visit: Payer: Self-pay | Admitting: Family Medicine

## 2024-04-04 ENCOUNTER — Ambulatory Visit (INDEPENDENT_AMBULATORY_CARE_PROVIDER_SITE_OTHER)

## 2024-04-04 ENCOUNTER — Ambulatory Visit: Admitting: Podiatry

## 2024-04-04 DIAGNOSIS — M7752 Other enthesopathy of left foot: Secondary | ICD-10-CM

## 2024-04-04 DIAGNOSIS — M7751 Other enthesopathy of right foot: Secondary | ICD-10-CM | POA: Diagnosis not present

## 2024-04-04 DIAGNOSIS — Q666 Other congenital valgus deformities of feet: Secondary | ICD-10-CM | POA: Diagnosis not present

## 2024-04-04 MED ORDER — MELOXICAM 15 MG PO TABS: 15.0000 mg | ORAL_TABLET | Freq: Every day | ORAL | 0 refills | Status: AC | PRN

## 2024-04-04 NOTE — Progress Notes (Signed)
 "  Subjective:  Patient ID: Jonathan Reeves, male    DOB: [birth year],  MRN: [id number]  Chief Complaint  Patient presents with   Foot Pain    LT foot is worse of the two. Pain and discomfort primarily in the ball of the LT foot. Feels swollen majority of the time, sore to touch. Worsens as the day goes on. He is on his feet for 40+ hours a day. Has purchased better quality shoe gear for running and walking. Currently wears New Balances, Skechers and work boots.  Not diabetic, no anticoag. Has also tried elevation and rest     Discussed the use of AI scribe software for clinical note transcription with the patient, who gave verbal consent to proceed.  History of Present Illness Jonathan Reeves is a 64 year old male with bilateral pes planus and calcaneal spurs who presents with chronic, bilateral forefoot pain.  He has persistent pain to the balls of both feet, left greater than right. Pain is constant with weight-bearing and recurs daily after rest, including overnight. He notes a sensation of swelling but no numbness, paresthesia, burning, or radiation into the toes.  He has not had prior medical evaluation for these symptoms. Supportive, higher quality shoes provide partial relief, while cheaper shoes worsen pain.  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 He exercises regularly with weight training, martial arts, and walk-run sessions. He is aware of longstanding heel spurs and has noticed callus formation in painful areas.      Objective:  There were no vitals filed for this visit.  Physical Exam General: AAO x3, NAD  Dermatological: Skin is warm, dry and supple bilateral. There are no open sores, no preulcerative lesions, no rash or signs of infection present.  Vascular: Dorsalis Pedis artery and Posterior Tibial artery pedal pulses are 2/4 bilateral with immedate capillary fill time. There is no pain with  calf compression, swelling, warmth, erythema.   Neruologic: Grossly intact via light touch bilateral.   Musculoskeletal: Decreased medial arch height.  Tenderness mostly in the second interspace on the left and some on the third interspace on the right.  There is no palpable neuroma.  There is no area of pinpoint tenderness.  Flexor, extensor tendons intact.  MMT 5/5.  Ankle range of motion intact but any pain.     No images are attached to the encounter.    Results Radiology Bilateral foot X-ray (04/04/2024): No acute fracture. Second metatarsal bilaterally is the longest. Mild hallux valgus, more pronounced on right. Mild joint space narrowing at first metatarsophalangeal joint bilaterally. Bilateral calcaneal spurs. No other abnormal findings. (Independently interpreted)   Assessment:   1. Capsulitis of metatarsophalangeal (MTP) joint of left foot   2. Capsulitis of metatarsophalangeal (MTP) joint of right foot      Plan:  Patient was evaluated and treated and all questions answered.  Assessment and Plan Assessment & Plan Metatarsalgia secondary to capsulitis of bilateral metatarsophalangeal joints Chronic bilateral metatarsalgia due to capsulitis, more severe on the left, linked to prolonged weight-bearing. Radiographs show long second metatarsals causing joint pressure and inflammation. No fractures or significant degeneration. Mild callus formation noted. - Prescribed meloxicam  PRN for pain and inflammation. - Initiated custom orthotic inserts for arch support and pressure redistribution. - Provided exercise worksheet for foot mechanics. - Arranged orthotic consultation for fitting.  Bilateral pes planus Bilateral pes planus confirmed, affecting gait mechanics and increasing stress  on metatarsophalangeal joints, contributing to heel spurs. No acute symptoms from pes planus, but it exacerbates foot pain and mechanical overload. - Recommended supportive footwear with arch  support. - Discussed custom orthotic inserts for alignment and pressure offloading. - Provided guidance on footwear rotation to reduce stress.      Return for orthotic pick-up.   Donnice JONELLE Fees DPM  "

## 2024-04-04 NOTE — Patient Instructions (Signed)
 If was nice to meet you today. If you have any questions or any further concerns, please feel fee to give me a call. You can call our office at [phone] or please feel fee to send me a message through MyChart.   --  Achilles Tendinitis  with Rehab Achilles tendinitis is a disorder of the Achilles tendon. The Achilles tendon connects the large calf muscles (Gastrocnemius and Soleus) to the heel bone (calcaneus). This tendon is sometimes called the heel cord. It is important for pushing-off and standing on your toes and is important for walking, running, or jumping. Tendinitis is often caused by overuse and repetitive microtrauma. SYMPTOMS Pain, tenderness, swelling, warmth, and redness may occur over the Achilles tendon even at rest. Pain with pushing off, or flexing or extending the ankle. Pain that is worsened after or during activity. CAUSES  Overuse sometimes seen with rapid increase in exercise programs or in sports requiring running and jumping. Poor physical conditioning (strength and flexibility or endurance). Running sports, especially training running down hills. Inadequate warm-up before practice or play or failure to stretch before participation. Injury to the tendon. PREVENTION  Warm up and stretch before practice or competition. Allow time for adequate rest and recovery between practices and competition. Keep up conditioning. Keep up ankle and leg flexibility. Improve or keep muscle strength and endurance. Improve cardiovascular fitness. Use proper technique. Use proper equipment (shoes, skates). To help prevent recurrence, taping, protective strapping, or an adhesive bandage may be recommended for several weeks after healing is complete. PROGNOSIS  Recovery may take weeks to several months to heal. Longer recovery is expected if symptoms have been prolonged. Recovery is usually quicker if the inflammation is due to a direct blow as compared with overuse or sudden  strain. RELATED COMPLICATIONS  Healing time will be prolonged if the condition is not correctly treated. The injury must be given plenty of time to heal. Symptoms can reoccur if activity is resumed too soon. Untreated, tendinitis may increase the risk of tendon rupture requiring additional time for recovery and possibly surgery. TREATMENT  The first treatment consists of rest anti-inflammatory medication, and ice to relieve the pain. Stretching and strengthening exercises after resolution of pain will likely help reduce the risk of recurrence. Referral to a physical therapist or athletic trainer for further evaluation and treatment may be helpful. A walking boot or cast may be recommended to rest the Achilles tendon. This can help break the cycle of inflammation and microtrauma. Arch supports (orthotics) may be prescribed or recommended by your caregiver as an adjunct to therapy and rest. Surgery to remove the inflamed tendon lining or degenerated tendon tissue is rarely necessary and has shown less than predictable results. MEDICATION  Nonsteroidal anti-inflammatory medications, such as aspirin and ibuprofen, may be used for pain and inflammation relief. Do not take within 7 days before surgery. Take these as directed by your caregiver. Contact your caregiver immediately if any bleeding, stomach upset, or signs of allergic reaction occur. Other minor pain relievers, such as acetaminophen, may also be used. Pain relievers may be prescribed as necessary by your caregiver. Do not take prescription pain medication for longer than 4 to 7 days. Use only as directed and only as much as you need. Cortisone injections are rarely indicated. Cortisone injections may weaken tendons and predispose to rupture. It is better to give the condition more time to heal than to use them. HEAT AND COLD Cold is used to relieve pain and reduce inflammation for  acute and chronic Achilles tendinitis. Cold should be applied  for 10 to 15 minutes every 2 to 3 hours for inflammation and pain and immediately after any activity that aggravates your symptoms. Use ice packs or an ice massage. Heat may be used before performing stretching and strengthening activities prescribed by your caregiver. Use a heat pack or a warm soak. SEEK MEDICAL CARE IF: Symptoms get worse or do not improve in 2 weeks despite treatment. New, unexplained symptoms develop. Drugs used in treatment may produce side effects.  EXERCISES:  RANGE OF MOTION (ROM) AND STRETCHING EXERCISES - Achilles Tendinitis  These exercises may help you when beginning to rehabilitate your injury. Your symptoms may resolve with or without further involvement from your physician, physical therapist or athletic trainer. While completing these exercises, remember:  Restoring tissue flexibility helps normal motion to return to the joints. This allows healthier, less painful movement and activity. An effective stretch should be held for at least 30 seconds. A stretch should never be painful. You should only feel a gentle lengthening or release in the stretched tissue.  STRETCH  Gastroc, Standing  Place hands on wall. Extend right / left leg, keeping the front knee somewhat bent. Slightly point your toes inward on your back foot. Keeping your right / left heel on the floor and your knee straight, shift your weight toward the wall, not allowing your back to arch. You should feel a gentle stretch in the right / left calf. Hold this position for 10 seconds. Repeat 3 times. Complete this stretch 2 times per day.  STRETCH  Soleus, Standing  Place hands on wall. Extend right / left leg, keeping the other knee somewhat bent. Slightly point your toes inward on your back foot. Keep your right / left heel on the floor, bend your back knee, and slightly shift your weight over the back leg so that you feel a gentle stretch deep in your back calf. Hold this position for 10  seconds. Repeat 3 times. Complete this stretch 2 times per day.  STRETCH  Gastrocsoleus, Standing  Note: This exercise can place a lot of stress on your foot and ankle. Please complete this exercise only if specifically instructed by your caregiver.  Place the ball of your right / left foot on a step, keeping your other foot firmly on the same step. Hold on to the wall or a rail for balance. Slowly lift your other foot, allowing your body weight to press your heel down over the edge of the step. You should feel a stretch in your right / left calf. Hold this position for 10 seconds. Repeat this exercise with a slight bend in your knee. Repeat 3 times. Complete this stretch 2 times per day.   STRENGTHENING EXERCISES - Achilles Tendinitis These exercises may help you when beginning to rehabilitate your injury. They may resolve your symptoms with or without further involvement from your physician, physical therapist or athletic trainer. While completing these exercises, remember:  Muscles can gain both the endurance and the strength needed for everyday activities through controlled exercises. Complete these exercises as instructed by your physician, physical therapist or athletic trainer. Progress the resistance and repetitions only as guided. You may experience muscle soreness or fatigue, but the pain or discomfort you are trying to eliminate should never worsen during these exercises. If this pain does worsen, stop and make certain you are following the directions exactly. If the pain is still present after adjustments, discontinue the exercise until  you can discuss the trouble with your clinician.  STRENGTH - Plantar-flexors  Sit with your right / left leg extended. Holding onto both ends of a rubber exercise band/tubing, loop it around the ball of your foot. Keep a slight tension in the band. Slowly push your toes away from you, pointing them downward. Hold this position for 10 seconds. Return  slowly, controlling the tension in the band/tubing. Repeat 3 times. Complete this exercise 2 times per day.   STRENGTH - Plantar-flexors  Stand with your feet shoulder width apart. Steady yourself with a wall or table using as little support as needed. Keeping your weight evenly spread over the width of your feet, rise up on your toes.* Hold this position for 10 seconds. Repeat 3 times. Complete this exercise 2 times per day.  *If this is too easy, shift your weight toward your right / left leg until you feel challenged. Ultimately, you may be asked to do this exercise with your right / left foot only.  STRENGTH  Plantar-flexors, Eccentric  Note: This exercise can place a lot of stress on your foot and ankle. Please complete this exercise only if specifically instructed by your caregiver.  Place the balls of your feet on a step. With your hands, use only enough support from a wall or rail to keep your balance. Keep your knees straight and rise up on your toes. Slowly shift your weight entirely to your right / left toes and pick up your opposite foot. Gently and with controlled movement, lower your weight through your right / left foot so that your heel drops below the level of the step. You will feel a slight stretch in the back of your calf at the end position. Use the healthy leg to help rise up onto the balls of both feet, then lower weight only on the right / left leg again. Build up to 15 repetitions. Then progress to 3 consecutive sets of 15 repetitions.* After completing the above exercise, complete the same exercise with a slight knee bend (about 30 degrees). Again, build up to 15 repetitions. Then progress to 3 consecutive sets of 15 repetitions.* Perform this exercise 2 times per day.  *When you easily complete 3 sets of 15, your physician, physical therapist or athletic trainer may advise you to add resistance by wearing a backpack filled with additional weight.  STRENGTH - Plantar  Flexors, Seated  Sit on a chair that allows your feet to rest flat on the ground. If necessary, sit at the edge of the chair. Keeping your toes firmly on the ground, lift your right / left heel as far as you can without increasing any discomfort in your ankle. Repeat 3 times. Complete this exercise 2 times a day.  -- Meloxicam  Tablets What is this medication? MELOXICAM  (mel OX i cam) treats mild to moderate pain, inflammation, or arthritis. It works by decreasing inflammation. It belongs to a group of medications called NSAIDs. This medicine may be used for other purposes; ask your health care provider or pharmacist if you have questions. COMMON BRAND NAME(S): Mobic  What should I tell my care team before I take this medication? They need to know if you have any of these conditions: Asthma Bleeding problems Dehydration Frequently drink alcohol Have had a heart attack, stroke, or mini-stroke Heart bypass surgery, or CABG, within the past 2 weeks Heart or blood vessel conditions Heart failure High blood pressure Kidney disease Liver disease Stomach bleeding Stomach ulcers, other stomach or intestine  problems Tobacco use An unusual or allergic reaction to meloxicam , other medications, foods, dyes, or preservatives Pregnant or trying to get pregnant Breastfeeding How should I use this medication? Take this medication by mouth. Take it as directed on the prescription label at the same time every day. You can take it with or without food. If it upsets your stomach, take it with food. Do not use it more often than directed. There may be unused or extra doses in the bottle after you finish your treatment. Talk to your care team if you have questions about your dose. A special MedGuide will be given to you by the pharmacist with each prescription and refill. Be sure to read this information carefully each time. Talk to your care team about the use of this medication in children. Special care  may be needed. People over 32 years of age may have a stronger reaction and need a smaller dose. Overdosage: If you think you have taken too much of this medicine contact a poison control center or emergency room at once. NOTE: This medicine is only for you. Do not share this medicine with others. What if I miss a dose? If you miss a dose, take it as soon as you can. If it is almost time for your next dose, take only that dose. Do not take double or extra doses. What may interact with this medication? Do not take this medication with any of the following: Cidofovir Ketorolac This medication may also interact with the following: Alcohol Aspirin and aspirin-like medications Blood thinners Cyclosporine Digoxin Diuretics Lithium Medications for high blood pressure Methotrexate Other NSAIDs, medications for pain and inflammation, such as ibuprofen or naproxen Some medications for depression Steroid medications, such as prednisone or cortisone Supplements, such as garlic, ginger, ginkgo, methylsulfonylmethane (MSM) This list may not describe all possible interactions. Give your health care provider a list of all the medicines, herbs, non-prescription drugs, or dietary supplements you use. Also tell them if you smoke, drink alcohol, or use illegal drugs. Some items may interact with your medicine. What should I watch for while using this medication? Visit your care team for regular checks on your progress. Tell your care team if your symptoms do not start to get better or if they get worse. Do not take aspirin or other NSAIDs, such as ibuprofen or naproxen, while you are taking this medication. Side effects, such as upset stomach, nausea, and ulcers, may be more likely to occur. Many over-the-counter medications contain aspirin, ibuprofen, or naproxen. It is important to read labels carefully. Talk to your care team about all the medications you take. They can tell you what is safe to take  together. This medication can cause serious bleeding, ulcers, or tears in the stomach. These problems can occur at any time and with no warning signs. They are more common with long-term use. Talk to your care team right away if you have stomach pain, bloody or black, tar-like stools, or vomit blood that is red or looks like coffee grounds. This medication increases the risk of blood clots, heart attack, and stroke. These events can occur at any time. They are more common with long-term use and in those who have heart disease. If you take aspirin to prevent a heart attack or stroke, talk to your care team. They can help you find an option that works for you. This medication may cause serious skin reactions. They can happen weeks to months after starting the medication. Talk to your  care team right away if you have fevers or flu-like symptoms with a rash. The rash may be red or purple and then turn into blisters or peeling of the skin. Or you might notice a red rash with swelling of the face, lips, or lymph nodes in your neck or under your arms. Talk to your care team if you may be pregnant. Taking this medication after 20 weeks of pregnancy may cause serious birth defects. Use of this medication after 30 weeks of pregnancy is not recommended. This medication may cause infertility. It is usually temporary. Talk to your care team if you are concerned about your fertility. What side effects may I notice from receiving this medication? Side effects that you should report to your care team as soon as possible: Allergic reactions--skin rash, itching, hives, swelling of the face, lips, tongue, or throat Bleeding--bloody or black, tar-like stools, vomiting blood or brown material that looks like coffee grounds, red or dark brown urine, small red or purple spots on skin, unusual bruising or bleeding Heart attack--pain or tightness in the chest, shoulders, arms, or jaw, nausea, shortness of breath, cold or clammy  skin, feeling faint or lightheaded Heart failure--shortness of breath, swelling of the ankles, feet, or hands, sudden weight gain, unusual weakness or fatigue Increase in blood pressure Kidney injury--decrease in the amount of urine, swelling of the ankles, hands, or feet Liver injury--right upper belly pain, loss of appetite, nausea, light-colored stool, dark yellow or brown urine, yellowing skin or eyes, unusual weakness or fatigue Rash, fever, and swollen lymph nodes Redness, blistering, peeling, or loosening of the skin, including inside the mouth Round red or dark patches on the skin that may itch, burn, and blister Stroke--sudden numbness or weakness of the face, arm, or leg, trouble speaking, confusion, trouble walking, loss of balance or coordination, dizziness, severe headache, change in vision Side effects that usually do not require medical attention (report these to your care team if they continue or are bothersome): Headache Loss of appetite Nausea Upset stomach This list may not describe all possible side effects. Call your doctor for medical advice about side effects. You may report side effects to FDA at 1-800-FDA-1088. Where should I keep my medication? Keep out of the reach of children and pets. Store at room temperature between 20 and 25 degrees C (68 and 77 degrees F). Protect from moisture. Keep the container tightly closed. Get rid of any unused medication after the expiration date. To get rid of medications that are no longer needed or have expired: Take the medication to a medication take-back program. Check with your pharmacy or law enforcement to find a location. If you cannot return the medication, check the label or package insert to see if the medication should be thrown out in the garbage or flushed down the toilet. If you are not sure, ask your care team. If it is safe to put it in the trash, empty the medication out of the container. Mix the medication with cat  litter, dirt, coffee grounds, or other unwanted substance. Seal the mixture in a bag or container. Put it in the trash. NOTE: This sheet is a summary. It may not cover all possible information. If you have questions about this medicine, talk to your doctor, pharmacist, or health care provider.  2025 [employer] Standard (2023-05-01 00:00:00)

## 2024-04-11 ENCOUNTER — Telehealth: Payer: Self-pay

## 2024-04-11 NOTE — Telephone Encounter (Signed)
 Copied from CRM [id number]. Topic: Clinical - Lab/Test Results >> Apr 10, 2024  2:12 PM Fonda T wrote: Reason for CRM: Pt calling to review lab results, reports he is unable to view via mychart.  Per provider notes, results read verbatim: Result Note Labs are outstanding  Pt informed of above, verbalized understanding, no further assistance needed at this time.   Pt is requesting a copy of lab results be mailed to home address as listed on file.   Sending CRM to office for visibility.

## 2025-04-02 ENCOUNTER — Encounter: Admitting: Family Medicine
